# Patient Record
Sex: Female | Born: 1985 | Race: White | Hispanic: No | Marital: Married | Smoking: Former smoker
Health system: Southern US, Community
[De-identification: ages and names within clinical notes are randomized; demographics above are authoritative.]

## PROBLEM LIST (undated history)

## (undated) DIAGNOSIS — IMO0002 Reserved for concepts with insufficient information to code with codable children: Secondary | ICD-10-CM

## (undated) DIAGNOSIS — F329 Major depressive disorder, single episode, unspecified: Secondary | ICD-10-CM

## (undated) DIAGNOSIS — Z8639 Personal history of other endocrine, nutritional and metabolic disease: Secondary | ICD-10-CM

## (undated) DIAGNOSIS — E041 Nontoxic single thyroid nodule: Secondary | ICD-10-CM

## (undated) DIAGNOSIS — E079 Disorder of thyroid, unspecified: Secondary | ICD-10-CM

## (undated) DIAGNOSIS — F32A Depression, unspecified: Secondary | ICD-10-CM

## (undated) DIAGNOSIS — R87629 Unspecified abnormal cytological findings in specimens from vagina: Secondary | ICD-10-CM

## (undated) DIAGNOSIS — J302 Other seasonal allergic rhinitis: Secondary | ICD-10-CM

## (undated) DIAGNOSIS — F419 Anxiety disorder, unspecified: Secondary | ICD-10-CM

## (undated) DIAGNOSIS — B977 Papillomavirus as the cause of diseases classified elsewhere: Secondary | ICD-10-CM

## (undated) HISTORY — DX: Papillomavirus as the cause of diseases classified elsewhere: B97.7

## (undated) HISTORY — DX: Reserved for concepts with insufficient information to code with codable children: IMO0002

## (undated) HISTORY — DX: Depression, unspecified: F32.A

## (undated) HISTORY — PX: TUBAL LIGATION: SHX77

## (undated) HISTORY — PX: COLPOSCOPY: SHX161

## (undated) HISTORY — DX: Anxiety disorder, unspecified: F41.9

## (undated) HISTORY — DX: Disorder of thyroid, unspecified: E07.9

## (undated) HISTORY — DX: Major depressive disorder, single episode, unspecified: F32.9

## (undated) HISTORY — DX: Personal history of other endocrine, nutritional and metabolic disease: Z86.39

## (undated) HISTORY — DX: Unspecified abnormal cytological findings in specimens from vagina: R87.629

---
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
## 2009-11-22 DIAGNOSIS — R87619 Unspecified abnormal cytological findings in specimens from cervix uteri: Secondary | ICD-10-CM

## 2009-11-22 DIAGNOSIS — IMO0002 Reserved for concepts with insufficient information to code with codable children: Secondary | ICD-10-CM

## 2009-11-22 HISTORY — DX: Reserved for concepts with insufficient information to code with codable children: IMO0002

## 2009-11-22 HISTORY — DX: Unspecified abnormal cytological findings in specimens from cervix uteri: R87.619

## 2009-11-22 HISTORY — PX: CHOLECYSTECTOMY: SHX55

## 2010-02-27 ENCOUNTER — Inpatient Hospital Stay (HOSPITAL_COMMUNITY): Admission: AD | Admit: 2010-02-27 | Discharge: 2010-02-28 | Payer: Self-pay | Admitting: Obstetrics and Gynecology

## 2010-07-22 ENCOUNTER — Inpatient Hospital Stay (HOSPITAL_COMMUNITY): Admission: RE | Admit: 2010-07-22 | Discharge: 2010-07-24 | Payer: Self-pay | Admitting: Obstetrics and Gynecology

## 2010-09-05 ENCOUNTER — Emergency Department (HOSPITAL_COMMUNITY): Admission: EM | Admit: 2010-09-05 | Discharge: 2010-09-06 | Payer: Self-pay | Admitting: Emergency Medicine

## 2011-02-03 LAB — URINALYSIS, ROUTINE W REFLEX MICROSCOPIC
Glucose, UA: NEGATIVE mg/dL
Ketones, ur: NEGATIVE mg/dL
Nitrite: NEGATIVE
Protein, ur: NEGATIVE mg/dL

## 2011-02-03 LAB — CBC
HCT: 35.8 % — ABNORMAL LOW (ref 36.0–46.0)
Hemoglobin: 12.2 g/dL (ref 12.0–15.0)
MCHC: 34.1 g/dL (ref 30.0–36.0)
MCV: 92.3 fL (ref 78.0–100.0)
WBC: 12.6 10*3/uL — ABNORMAL HIGH (ref 4.0–10.5)

## 2011-02-03 LAB — COMPREHENSIVE METABOLIC PANEL
ALT: 20 U/L (ref 0–35)
Alkaline Phosphatase: 75 U/L (ref 39–117)
CO2: 22 mEq/L (ref 19–32)
GFR calc non Af Amer: >60 mL/min (ref >60)
Glucose, Bld: 171 mg/dL — ABNORMAL HIGH (ref 70–99)
Potassium: 3.5 mEq/L (ref 3.5–5.1)
Sodium: 137 mEq/L (ref 135–145)

## 2011-02-03 LAB — DIFFERENTIAL
Basophils Absolute: 0 10*3/uL (ref 0.0–0.1)
Basophils Relative: 0 % (ref 0–1)
Eosinophils Absolute: 0.2 10*3/uL (ref 0.0–0.7)
Neutrophils Relative %: 70 % (ref 43–77)

## 2011-02-03 LAB — URINE MICROSCOPIC-ADD ON

## 2011-02-03 LAB — LIPASE, BLOOD: Lipase: 28 U/L (ref 11–59)

## 2011-02-04 LAB — CBC
Hemoglobin: 10.9 g/dL — ABNORMAL LOW (ref 12.0–15.0)
MCH: 33.6 pg (ref 26.0–34.0)
RBC: 3.23 MIL/uL — ABNORMAL LOW (ref 3.87–5.11)

## 2011-02-05 LAB — CBC
HCT: 35.2 % — ABNORMAL LOW (ref 36.0–46.0)
MCV: 98.2 fL (ref 78.0–100.0)
RDW: 12.9 % (ref 11.5–15.5)
WBC: 15 10*3/uL — ABNORMAL HIGH (ref 4.0–10.5)

## 2011-02-10 LAB — WET PREP, GENITAL: Yeast Wet Prep HPF POC: NONE SEEN

## 2011-02-10 LAB — URINE CULTURE: Culture: NO GROWTH

## 2011-02-10 LAB — URINE MICROSCOPIC-ADD ON

## 2011-02-10 LAB — URINALYSIS, ROUTINE W REFLEX MICROSCOPIC
Glucose, UA: NEGATIVE mg/dL
Protein, ur: NEGATIVE mg/dL
Specific Gravity, Urine: <1.005 — ABNORMAL LOW (ref 1.005–1.030)

## 2011-10-13 ENCOUNTER — Other Ambulatory Visit: Payer: Self-pay | Admitting: Internal Medicine

## 2011-10-13 DIAGNOSIS — R5383 Other fatigue: Secondary | ICD-10-CM

## 2011-10-13 DIAGNOSIS — E049 Nontoxic goiter, unspecified: Secondary | ICD-10-CM

## 2011-10-21 ENCOUNTER — Other Ambulatory Visit: Payer: Self-pay

## 2011-11-11 ENCOUNTER — Ambulatory Visit: Payer: Self-pay | Admitting: Family Medicine

## 2011-11-23 ENCOUNTER — Ambulatory Visit (INDEPENDENT_AMBULATORY_CARE_PROVIDER_SITE_OTHER): Payer: PRIVATE HEALTH INSURANCE

## 2011-11-23 DIAGNOSIS — J029 Acute pharyngitis, unspecified: Secondary | ICD-10-CM

## 2011-11-23 DIAGNOSIS — J019 Acute sinusitis, unspecified: Secondary | ICD-10-CM

## 2011-11-23 DIAGNOSIS — R05 Cough: Secondary | ICD-10-CM

## 2011-11-23 DIAGNOSIS — R059 Cough, unspecified: Secondary | ICD-10-CM

## 2012-03-02 ENCOUNTER — Ambulatory Visit (INDEPENDENT_AMBULATORY_CARE_PROVIDER_SITE_OTHER): Payer: PRIVATE HEALTH INSURANCE | Admitting: Emergency Medicine

## 2012-03-02 VITALS — BP 100/70 | HR 87 | Temp 98.6°F | Resp 18 | Ht 65.5 in | Wt 156.0 lb

## 2012-03-02 DIAGNOSIS — E860 Dehydration: Secondary | ICD-10-CM

## 2012-03-02 DIAGNOSIS — R197 Diarrhea, unspecified: Secondary | ICD-10-CM

## 2012-03-02 DIAGNOSIS — R1110 Vomiting, unspecified: Secondary | ICD-10-CM

## 2012-03-02 DIAGNOSIS — R112 Nausea with vomiting, unspecified: Secondary | ICD-10-CM

## 2012-03-02 LAB — POCT CBC
HCT, POC: 46.4 % (ref 37.7–47.9)
Lymph, poc: 1 (ref 0.6–3.4)
MCHC: 33 g/dL (ref 31.8–35.4)
MCV: 93.8 fL (ref 80–97)
POC LYMPH PERCENT: 13.3 %L (ref 10–50)
RDW, POC: 12.6 %
WBC: 7.2 10*3/uL (ref 4.6–10.2)

## 2012-03-02 MED ORDER — ONDANSETRON 8 MG PO TBDP: 8.0000 mg | ORAL_TABLET | Freq: Three times a day (TID) | ORAL | Status: AC | PRN

## 2012-03-02 MED ORDER — ONDANSETRON 4 MG PO TBDP
8.0000 mg | ORAL_TABLET | Freq: Once | ORAL | Status: AC
  Administered 2012-03-02: 8 mg via ORAL

## 2012-03-02 NOTE — Progress Notes (Signed)
  Subjective:    Patient ID: Brandi Burns, female    DOB: 05/06/1986, 26 y.o.   MRN: [id number]  HPI patient enters with onset on Tuesday of nausea vomiting and diarrhea. She has felt achy like she has the flu. She denies been on antibiotics recently. She denies any recent travel. She has no ongoing medical problems. She states that no one else at home is sick.    Review of Systems noncontributory except as relates to the symptoms     Objective:   Physical Exam  Constitutional: She appears well-developed and well-nourished.  HENT:  Head: Normocephalic.  Right Ear: External ear normal.  Left Ear: External ear normal.  Eyes: Pupils are equal, round, and reactive to light.  Neck: No tracheal deviation present. No thyromegaly present.  Cardiovascular: Normal rate and regular rhythm.   Pulmonary/Chest: No respiratory distress. She has no wheezes. She has no rales. She exhibits no tenderness.  Abdominal: She exhibits no distension. There is no tenderness. There is no rebound and no guarding.          Assessment & Plan:   I suspect this is a viral gastroenteritis. She has had myalgias associated with the vomiting and diarrhea. I suspect her black stools are secondary to the Pepto-Bismol she has been taking. We'll check a CBC and hemosure and go ahead and give IV fluids.

## 2012-03-02 NOTE — Patient Instructions (Signed)
Viral Gastroenteritis Viral gastroenteritis is also known as stomach flu. This condition affects the stomach and intestinal tract. It can cause sudden diarrhea and vomiting. The illness typically lasts 3 to 8 days. Most people develop an immune response that eventually gets rid of the virus. While this natural response develops, the virus can make you quite ill. CAUSES  Many different viruses can cause gastroenteritis, such as rotavirus or noroviruses. You can catch one of these viruses by consuming contaminated food or water. You may also catch a virus by sharing utensils or other personal items with an infected person or by touching a contaminated surface. SYMPTOMS  The most common symptoms are diarrhea and vomiting. These problems can cause a severe loss of body fluids (dehydration) and a body salt (electrolyte) imbalance. Other symptoms may include:  Fever.   Headache.   Fatigue.   Abdominal pain.  DIAGNOSIS  Your caregiver can usually diagnose viral gastroenteritis based on your symptoms and a physical exam. A stool sample may also be taken to test for the presence of viruses or other infections. TREATMENT  This illness typically goes away on its own. Treatments are aimed at rehydration. The most serious cases of viral gastroenteritis involve vomiting so severely that you are not able to keep fluids down. In these cases, fluids must be given through an intravenous line (IV). HOME CARE INSTRUCTIONS   Drink enough fluids to keep your urine clear or pale yellow. Drink small amounts of fluids frequently and increase the amounts as tolerated.   Ask your caregiver for specific rehydration instructions.   Avoid:   Foods high in sugar.   Alcohol.   Carbonated drinks.   Tobacco.   Juice.   Caffeine drinks.   Extremely hot or cold fluids.   Fatty, greasy foods.   Too much intake of anything at one time.   Dairy products until 24 to 48 hours after diarrhea stops.   You may  consume probiotics. Probiotics are active cultures of beneficial bacteria. They may lessen the amount and number of diarrheal stools in adults. Probiotics can be found in yogurt with active cultures and in supplements.   Wash your hands well to avoid spreading the virus.   Only take over-the-counter or prescription medicines for pain, discomfort, or fever as directed by your caregiver. Do not give aspirin to children. Antidiarrheal medicines are not recommended.   Ask your caregiver if you should continue to take your regular prescribed and over-the-counter medicines.   Keep all follow-up appointments as directed by your caregiver.  SEEK IMMEDIATE MEDICAL CARE IF:   You are unable to keep fluids down.   You do not urinate at least once every 6 to 8 hours.   You develop shortness of breath.   You notice blood in your stool or vomit. This may look like coffee grounds.   You have abdominal pain that increases or is concentrated in one small area (localized).   You have persistent vomiting or diarrhea.   You have a fever.   The patient is a child younger than 3 months, and he or she has a fever.   The patient is a child older than 3 months, and he or she has a fever and persistent symptoms.   The patient is a child older than 3 months, and he or she has a fever and symptoms suddenly get worse.   The patient is a baby, and he or she has no tears when crying.  MAKE SURE YOU:     Understand these instructions.   Will watch your condition.   Will get help right away if you are not doing well or get worse.  Document Released: 11/08/2005 Document Revised: 10/28/2011 Document Reviewed: 08/25/2011 [employer] Patient Information 2012 [employer]. 

## 2012-06-27 ENCOUNTER — Ambulatory Visit (INDEPENDENT_AMBULATORY_CARE_PROVIDER_SITE_OTHER): Payer: Self-pay | Admitting: Obstetrics and Gynecology

## 2012-06-27 VITALS — Temp 98.1°F

## 2012-06-27 DIAGNOSIS — H6690 Otitis media, unspecified, unspecified ear: Secondary | ICD-10-CM | POA: Insufficient documentation

## 2012-06-27 MED ORDER — AZITHROMYCIN 1 G PO PACK: 1.0000 | PACK | Freq: Once | ORAL | Status: AC

## 2012-06-27 NOTE — Progress Notes (Signed)
PROBLEM VISIT  Brandi Burns is a 26 y.o. year old female,., who presents for a problem visit.   Subjective:  Pt gives a 24 hr hx of generalized malaise, upper respiratory stuffiness, and bilateral ear pain.  No cough.   Objective:  Temp 98.1 F (36.7 C)  HEENT:   No submandibular, pre-auricular, posterior canalicular lymphadenopathy. Thyroid is normal the left ear contains fluid behind the drum.  There is no mandibular sinus tenderness.  Assessment: Otitis   Plan: Over-the-counter Zyrtec once daily Z-Pak for 5 days Return to office prn if symptoms worsen or fail to improve.   Dierdre Forth,   06/28/2012 5:12 PM

## 2012-10-23 ENCOUNTER — Ambulatory Visit (INDEPENDENT_AMBULATORY_CARE_PROVIDER_SITE_OTHER): Payer: PRIVATE HEALTH INSURANCE | Admitting: Obstetrics and Gynecology

## 2012-10-23 ENCOUNTER — Encounter: Payer: Self-pay | Admitting: Obstetrics and Gynecology

## 2012-10-23 VITALS — BP 92/64 | HR 72 | Ht 65.0 in | Wt 145.0 lb

## 2012-10-23 DIAGNOSIS — Z30432 Encounter for removal of intrauterine contraceptive device: Secondary | ICD-10-CM

## 2012-10-23 DIAGNOSIS — E049 Nontoxic goiter, unspecified: Secondary | ICD-10-CM

## 2012-10-23 DIAGNOSIS — IMO0001 Reserved for inherently not codable concepts without codable children: Secondary | ICD-10-CM

## 2012-10-23 DIAGNOSIS — Z124 Encounter for screening for malignant neoplasm of cervix: Secondary | ICD-10-CM

## 2012-10-23 DIAGNOSIS — E010 Iodine-deficiency related diffuse (endemic) goiter: Secondary | ICD-10-CM

## 2012-10-23 DIAGNOSIS — Z309 Encounter for contraceptive management, unspecified: Secondary | ICD-10-CM

## 2012-10-23 NOTE — Progress Notes (Signed)
Regular Periods: no Mammogram: no  Monthly Breast Ex.: no Exercise: yes  Tetanus < 10 years: yes Seatbelts: yes  NI. Bladder Functn.: yes Abuse at home: no  Daily BM's: yes Stressful Work: yes  Healthy Diet: yes Sigmoid-Colonoscopy: NO  Calcium: no Medical problems this year: WANT IUD REMOVED AND DISCUSS ANOTHER OPTION   LAST PAP:12/12  NL  Contraception:IUD  MIRENA  Mammogram:  NO  PCP: NO  PMH: NO CHANGE  FMH: NO CHANGE   Last Bone Scan: NO  PT IS MARRIED.

## 2012-10-23 NOTE — Progress Notes (Signed)
Subjective:    Brandi Burns is a 26 y.o. female, G2P2, who presents for an annual exam. The patient reports that since she's had the Mirena IUD she's had problems with acne and therefore wants it removed and to resume BCPs.  Reviewed VTE risks of BCPs and need to dose at the same time daily.  Menstrual cycle:   LMP: Patient's last menstrual period was 10/13/2012.             Review of Systems Pertinent items are noted in HPI. Denies pelvic pain, urinary tract symptoms, vaginitis symptoms, irregular bleeding, menopausal symptoms, change in bowel habits or rectal bleeding   Objective:    BP 92/64  Pulse 72  Wt 145 lb (65.772 kg)  LMP 10/13/2012    Wt Readings from Last 1 Encounters:  10/23/12 145 lb (65.772 kg)   There is no height on file to calculate BMI. General Appearance: Alert, no acute distress HEENT: Grossly normal; acne Neck / Thyroid: Supple, diffuse  thyromegaly but no cervical adenopathy Lungs: Clear to auscultation bilaterally Back: No CVA tenderness Breast Exam: No masses or nodes.No dimpling, nipple retraction or discharge. Cardiovascular: Regular rate and rhythm.  Gastrointestinal: Soft, non-tender, no masses or organomegaly Pelvic Exam: EGBUS-wnl, vagina-normal rugae, cervix- without lesions or tenderness, IUD removed without difficulty; uterus appears normal size shape and consistency, adnexae-no masses or tenderness Lymphatic Exam: Non-palpable nodes in neck, clavicular,  axillary, or inguinal regions  Skin: no rashes or abnormalities Extremities: no clubbing cyanosis or edema  Neurologic: grossly normal Psychiatric: Alert and oriented   Assessment:   Routine GYN Exam IUD Removal Thyromegaly Family History of Thyroid Disease   Plan:  Generess Fe  #2 samples given to begin today 1 po qd  TSH and thyroid ultrasound to be scheduled the first of the year once patient's new insurance starts  PAP sent  RTO 1 year or prn  Jahkari Maclin,ELMIRAPA-C

## 2012-10-24 LAB — PAP IG W/ RFLX HPV ASCU

## 2012-11-06 ENCOUNTER — Other Ambulatory Visit: Payer: PRIVATE HEALTH INSURANCE

## 2012-11-06 ENCOUNTER — Other Ambulatory Visit: Payer: Self-pay | Admitting: Obstetrics and Gynecology

## 2012-11-06 DIAGNOSIS — J029 Acute pharyngitis, unspecified: Secondary | ICD-10-CM

## 2012-11-06 LAB — RAPID STREP SCREEN (MED CTR MEBANE ONLY): Streptococcus, Group A Screen (Direct): NEGATIVE

## 2012-12-01 ENCOUNTER — Other Ambulatory Visit: Payer: Self-pay

## 2012-12-01 DIAGNOSIS — E049 Nontoxic goiter, unspecified: Secondary | ICD-10-CM

## 2012-12-05 ENCOUNTER — Ambulatory Visit (INDEPENDENT_AMBULATORY_CARE_PROVIDER_SITE_OTHER): Payer: BC Managed Care – PPO | Admitting: Family Medicine

## 2012-12-05 VITALS — BP 124/70 | HR 79 | Temp 98.7°F | Resp 16 | Ht 66.0 in | Wt 152.0 lb

## 2012-12-05 DIAGNOSIS — R4586 Emotional lability: Secondary | ICD-10-CM

## 2012-12-05 DIAGNOSIS — R5383 Other fatigue: Secondary | ICD-10-CM

## 2012-12-05 DIAGNOSIS — R5381 Other malaise: Secondary | ICD-10-CM

## 2012-12-05 DIAGNOSIS — D72829 Elevated white blood cell count, unspecified: Secondary | ICD-10-CM

## 2012-12-05 DIAGNOSIS — F39 Unspecified mood [affective] disorder: Secondary | ICD-10-CM

## 2012-12-05 LAB — POCT CBC
Granulocyte percent: 63.1 %G (ref 37–80)
HCT, POC: 46.7 % (ref 37.7–47.9)
Hemoglobin: 14.8 g/dL (ref 12.2–16.2)
Lymph, poc: 3.7 — AB (ref 0.6–3.4)
MCH, POC: 31.1 pg (ref 27–31.2)
MCHC: 31.7 g/dL — AB (ref 31.8–35.4)
MCV: 98.1 fL — AB (ref 80–97)
MID (cbc): 0.5 (ref 0–0.9)
MPV: 8.9 fL (ref 0–99.8)
POC Granulocyte: 7.3 — AB (ref 2–6.9)
POC LYMPH PERCENT: 32.3 % (ref 10–50)
POC MID %: 4.6 % (ref 0–12)
Platelet Count, POC: 369 10*3/uL (ref 142–424)
RBC: 4.76 M/uL (ref 4.04–5.48)
RDW, POC: 12.5 %
WBC: 11.6 10*3/uL — AB (ref 4.6–10.2)

## 2012-12-05 LAB — POCT UA - MICROSCOPIC ONLY
Bacteria, U Microscopic: NEGATIVE
Casts, Ur, LPF, POC: NEGATIVE
Crystals, Ur, HPF, POC: NEGATIVE
Mucus, UA: NEGATIVE
WBC, Ur, HPF, POC: NEGATIVE
Yeast, UA: NEGATIVE

## 2012-12-05 LAB — POCT URINALYSIS DIPSTICK
Bilirubin, UA: NEGATIVE
Glucose, UA: NEGATIVE
Leukocytes, UA: NEGATIVE
Nitrite, UA: NEGATIVE
Protein, UA: NEGATIVE
Spec Grav, UA: 1.015
Urobilinogen, UA: 0.2
pH, UA: 6.5

## 2012-12-05 LAB — POCT SEDIMENTATION RATE: POCT SED RATE: 21 mm/h (ref 0–22)

## 2012-12-05 NOTE — Progress Notes (Signed)
 [facility]:  Office Visit  Chief Complaint:  Chief Complaint  Patient presents with  . Fatigue    extreme fatigue * about 6 months  . Mood Issues    wants to discuss mood issues    HPI: Brandi Burns is a 27 y.o. female who complains of  bad mood swings, fatigue for the last 6 months. She denies any weight gain, memory issues, dry skin, hair loss. Denies  Mania or  Depression. Checked thyroid with her other physician and TSH was normal at [place], [place]. Going to get [place] for ? Enlarge thyroid on Monday.  Irritable per patient-- for example will get irritated if husband moves in bed and she is in a comfortable position, turns the wrong way in bed and causese her to be uncomfortable and so she becomes moody. Not related to menses. Denies SI/HI or prior h/o mood disorders. Works as Lawyer   Past Medical History  Diagnosis Date  . HPV in female   . Abnormal Pap smear 2011   Past Surgical History  Procedure Date  . Cholecystectomy   . Colposcopy    History   Social History  . Marital Status: Married    Spouse Name: N/A    Number of Children: N/A  . Years of Education: N/A   Social History Main Topics  . Smoking status: Current Every Day Smoker -- 0.5 packs/day for 10 years    Types: Cigarettes  . Smokeless tobacco: Never Used  . Alcohol Use: No  . Drug Use: No  . Sexually Active: Yes    Birth Control/ Protection: IUD     Comment: MIRENA   Other Topics Concern  . None   Social History Narrative  . None   Family History  Problem Relation Age of Onset  . Heart disease Maternal Uncle   . Emphysema Maternal Grandmother   . Thyroid disease Maternal Grandmother   . Aneurysm Maternal Grandmother   . Heart disease Maternal Grandfather   . Diabetes Maternal Grandfather   . Hypertension Maternal Grandfather    Allergies  Allergen Reactions  . Wellbutrin (Bupropion Hcl) Hives   Prior to Admission medications   Medication Sig Start Date End  Date Taking? Authorizing Provider  Norethindrone-Ethinyl Estradiol-Fe (GENERESS FE) 0.8-25 MG-MCG tablet Chew 1 tablet by mouth daily.   Yes Historical Provider, MD  ALPRAZolam Prudy Feeler) 0.5 MG tablet Take 0.5 mg by mouth at bedtime as needed.    Historical Provider, MD     ROS: The patient denies fevers, chills, night sweats, unintentional weight loss, chest pain, palpitations, wheezing, dyspnea on exertion, nausea, vomiting, abdominal pain, dysuria, hematuria, melena, numbness, weakness, or tingling.   All other systems have been reviewed and were otherwise negative with the exception of those mentioned in the HPI and as above.    PHYSICAL EXAM: Filed Vitals:   12/05/12 1834  BP: 124/70  Pulse: 79  Temp: 98.7 F (37.1 C)  Resp: 16   Filed Vitals:   12/05/12 1834  Height: 5\' 6"  (1.676 m)  Weight: 152 lb (68.947 kg)   Body mass index is 24.53 kg/(m^2).  General: Alert, no acute distress HEENT:  Normocephalic, atraumatic, oropharynx patent. EOMI, PERRLA Cardiovascular:  Regular rate and rhythm, no rubs murmurs or gallops.  No Carotid bruits, radial pulse intact. No pedal edema.  Respiratory: Clear to auscultation bilaterally.  No wheezes, rales, or rhonchi.  No cyanosis, no use of accessory musculature GI: No organomegaly, abdomen is soft  and non-tender, positive bowel sounds.  No masses. Skin: No rashes. Neurologic: Facial musculature symmetric. Psychiatric: Patient is appropriate throughout our interaction. Lymphatic: No cervical lymphadenopathy Musculoskeletal: Gait intact.   LABS: Results for orders placed in visit on 12/05/12  POCT CBC      Component Value Range   WBC 11.6 (*) 4.6 - 10.2 K/uL   Lymph, poc 3.7 (*) 0.6 - 3.4   POC LYMPH PERCENT 32.3  10 - 50 %L   MID (cbc) 0.5  0 - 0.9   POC MID % 4.6  0 - 12 %M   POC Granulocyte 7.3 (*) 2 - 6.9   Granulocyte percent 63.1  37 - 80 %G   RBC 4.76  4.04 - 5.48 M/uL   Hemoglobin 14.8  12.2 - 16.2 g/dL   HCT, POC 96.0   45.4 - 47.9 %   MCV 98.1 (*) 80 - 97 fL   MCH, POC 31.1  27 - 31.2 pg   MCHC 31.7 (*) 31.8 - 35.4 g/dL   RDW, POC 09.8     Platelet Count, POC 369  142 - 424 K/uL   MPV 8.9  0 - 99.8 fL  POCT URINALYSIS DIPSTICK      Component Value Range   Color, UA yellow     Clarity, UA clear     Glucose, UA neg     Bilirubin, UA neg     Ketones, UA eng     Spec Grav, UA 1.015     Blood, UA moderate     pH, UA 6.5     Protein, UA neg     Urobilinogen, UA 0.2     Nitrite, UA neg     Leukocytes, UA Negative    POCT UA - MICROSCOPIC ONLY      Component Value Range   WBC, Ur, HPF, POC neg     RBC, urine, microscopic 2-6     Bacteria, U Microscopic neg     Mucus, UA neg     Epithelial cells, urine per micros 0-1     Crystals, Ur, HPF, POC neg     Casts, Ur, LPF, POC neg     Yeast, UA neg       EKG/XRAY:   Primary read interpreted by Dr. Conley Rolls at [facility].   ASSESSMENT/PLAN: Encounter Diagnoses  Name Primary?  . Mood swings Yes  . Fatigue   . Leukocytosis    She has a leukocytosis I can't explain and some hematuria. Denies vaginal sxs, urinary sxs. I will get the following labs. Labs pending: ESR, Vit B12, Folate, Vit D, CMP If labs are WNL then consider using Prozac 20 mg daily as a trial for irritability.  Planning to get US thyroid on MOnday with Ob/Gyn She denies any SI/HI.  F/u prn    ,  PHUONG, DO 12/05/2012 8:35 PM

## 2012-12-06 LAB — COMPREHENSIVE METABOLIC PANEL
ALT: <8 U/L (ref 0–35)
AST: 9 U/L (ref 0–37)
Albumin: 4.6 g/dL (ref 3.5–5.2)
CO2: 26 mEq/L (ref 19–32)
Calcium: 9.8 mg/dL (ref 8.4–10.5)
Chloride: 103 mEq/L (ref 96–112)
Creat: 0.6 mg/dL (ref 0.50–1.10)
Potassium: 4.1 mEq/L (ref 3.5–5.3)
Sodium: 139 mEq/L (ref 135–145)
Total Protein: 7 g/dL (ref 6.0–8.3)

## 2012-12-06 LAB — FOLATE: Folate: 18 ng/mL

## 2012-12-06 LAB — COMPREHENSIVE METABOLIC PANEL WITH GFR
Alkaline Phosphatase: 57 U/L (ref 39–117)
BUN: 7 mg/dL (ref 6–23)
Glucose, Bld: 95 mg/dL (ref 70–99)
Total Bilirubin: 0.3 mg/dL (ref 0.3–1.2)

## 2012-12-06 LAB — VITAMIN B12: Vitamin B-12: 281 pg/mL (ref 211–911)

## 2012-12-07 ENCOUNTER — Telehealth: Payer: Self-pay

## 2012-12-07 ENCOUNTER — Other Ambulatory Visit: Payer: Self-pay | Admitting: Family Medicine

## 2012-12-07 ENCOUNTER — Telehealth: Payer: Self-pay | Admitting: Family Medicine

## 2012-12-07 DIAGNOSIS — E569 Vitamin deficiency, unspecified: Secondary | ICD-10-CM

## 2012-12-07 DIAGNOSIS — F39 Unspecified mood [affective] disorder: Secondary | ICD-10-CM

## 2012-12-07 LAB — VITAMIN D 25 HYDROXY (VIT D DEFICIENCY, FRACTURES): Vit D, 25-Hydroxy: 21 ng/mL — ABNORMAL LOW (ref 30–89)

## 2012-12-07 MED ORDER — FLUOXETINE HCL 20 MG PO TABS: 20.0000 mg | ORAL_TABLET | Freq: Every day | ORAL | Status: DC

## 2012-12-07 MED ORDER — VITAMIN D (ERGOCALCIFEROL) 1.25 MG (50000 UNIT) PO CAPS: 50000.0000 [IU] | ORAL_CAPSULE | ORAL | Status: DC

## 2012-12-07 NOTE — Telephone Encounter (Signed)
Dr. Conley Rolls    Patient returned your call   CBN [phone]

## 2012-12-07 NOTE — Telephone Encounter (Signed)
LM to return call regarding labs. Sed rate, B 12 normal range. She is Vit D deficient, that may be why she is tired but I am not sure. We can try giving her Vit D and see how she does. For mood swings I would consider Prozac 20 mg and see how she does. Will d/w pt option once we talk about labs.

## 2012-12-11 ENCOUNTER — Ambulatory Visit
Admission: RE | Admit: 2012-12-11 | Discharge: 2012-12-11 | Disposition: A | Discharge status: Home / Self Care | Payer: BC Managed Care – PPO | Source: Ambulatory Visit | Attending: Obstetrics and Gynecology | Admitting: Obstetrics and Gynecology

## 2012-12-11 DIAGNOSIS — E049 Nontoxic goiter, unspecified: Secondary | ICD-10-CM

## 2012-12-13 ENCOUNTER — Other Ambulatory Visit: Payer: Self-pay | Admitting: Obstetrics and Gynecology

## 2013-01-12 ENCOUNTER — Other Ambulatory Visit: Payer: Self-pay | Admitting: Endocrinology

## 2013-01-12 DIAGNOSIS — E049 Nontoxic goiter, unspecified: Secondary | ICD-10-CM

## 2013-01-22 ENCOUNTER — Encounter: Payer: Self-pay | Admitting: Obstetrics and Gynecology

## 2013-01-22 ENCOUNTER — Ambulatory Visit: Payer: BC Managed Care – PPO | Admitting: Obstetrics and Gynecology

## 2013-01-22 VITALS — BP 114/60 | Temp 99.0°F | Wt 155.0 lb

## 2013-01-22 DIAGNOSIS — R05 Cough: Secondary | ICD-10-CM

## 2013-01-22 DIAGNOSIS — J329 Chronic sinusitis, unspecified: Secondary | ICD-10-CM

## 2013-01-22 MED ORDER — GUAIFENESIN-CODEINE 100-10 MG/5ML PO SOLN: 5.0000 mL | Freq: Three times a day (TID) | ORAL | Status: DC | PRN

## 2013-01-22 MED ORDER — AMOXICILLIN-POT CLAVULANATE 875-125 MG PO TABS: 1.0000 | ORAL_TABLET | Freq: Two times a day (BID) | ORAL | Status: DC

## 2013-01-22 NOTE — Progress Notes (Signed)
26 YO complaining of earache, green mucus from nose and cough since last week with the cough being disruptive but dry.  Nasal congestion has been bad but has not taking anything for it.  Has used saline nasal spray.  In addition to ears being painful she has popping-R > L.  Denies fever, body aches, nausea, vomiting or diarrhea.  O:  ENT: TMs obscured by cerumen on right and left with ? scar but no erythema, nares-boggy, oropharynx-no erythema       Neck: no masses or thyromegaly       Lungs: clear to auscultation but coarse breath sounds (patient coughing throughout exam       Heart: RRR  A: Sinusitis     Cough   P:  Norel AD #1 1 po stat           Augmentin 875 mg  #14 bid x 7 days no refills       Guaifenesin/Codiene #120 mL  5 mL tid prn no refills        Saline nasal rinse          Sudafed as directed         RTO-as scheduled or prn  POWELL,ELMIRA, PA-C

## 2013-01-26 ENCOUNTER — Other Ambulatory Visit: Payer: Self-pay | Admitting: Obstetrics and Gynecology

## 2013-01-26 MED ORDER — AMBULATORY NON FORMULARY MEDICATION: Status: DC

## 2013-01-26 MED ORDER — ALBUTEROL SULFATE HFA 108 (90 BASE) MCG/ACT IN AERS: 2.0000 | INHALATION_SPRAY | Freq: Four times a day (QID) | RESPIRATORY_TRACT | Status: DC | PRN

## 2013-01-26 MED ORDER — BENZONATATE 100 MG PO CAPS: 200.0000 mg | ORAL_CAPSULE | Freq: Three times a day (TID) | ORAL | Status: DC | PRN

## 2013-02-05 ENCOUNTER — Encounter: Payer: Self-pay | Admitting: Family Medicine

## 2013-02-05 ENCOUNTER — Other Ambulatory Visit: Payer: Self-pay | Admitting: Family Medicine

## 2013-02-05 ENCOUNTER — Telehealth: Payer: Self-pay | Admitting: Family Medicine

## 2013-04-04 ENCOUNTER — Ambulatory Visit (INDEPENDENT_AMBULATORY_CARE_PROVIDER_SITE_OTHER): Payer: BC Managed Care – PPO | Admitting: Emergency Medicine

## 2013-04-04 VITALS — BP 110/68 | HR 73 | Temp 98.1°F | Resp 16 | Ht 66.0 in | Wt 156.0 lb

## 2013-04-04 DIAGNOSIS — M25561 Pain in right knee: Secondary | ICD-10-CM

## 2013-04-04 DIAGNOSIS — M25569 Pain in unspecified knee: Secondary | ICD-10-CM

## 2013-04-04 DIAGNOSIS — M2390 Unspecified internal derangement of unspecified knee: Secondary | ICD-10-CM

## 2013-04-04 MED ORDER — NAPROXEN SODIUM 550 MG PO TABS: 550.0000 mg | ORAL_TABLET | Freq: Two times a day (BID) | ORAL | Status: DC

## 2013-04-04 NOTE — Progress Notes (Signed)
[facility] [address] [place] [address] [phone]  Date:  04/04/2013   Name:  Brandi Burns   DOB:  [birth year]   MRN:  [id number]  PCP:  Pcp Not In System    Chief Complaint: Knee Pain   History of Present Illness:  Brandi Burns is a 27 y.o. very pleasant female patient who presents with the following:  No history of injury or overuse of right knee. Has pain with extension and prolonged standing and walking.  No effusion. Has a click but no locking.  No history of prior knee injury.  Works as a Engineer, site.  No improvement with over the counter medications or other home remedies. Denies other complaint or health concern today.   Patient Active Problem List   Diagnosis Date Noted  . Otitis media 06/27/2012    Past Medical History  Diagnosis Date  . HPV in female   . Abnormal Pap smear 2011  . H/O thyroid nodule   . Depression   . Thyroid disease     Past Surgical History  Procedure Laterality Date  . Cholecystectomy    . Colposcopy      History  Substance Use Topics  . Smoking status: Current Every Day Smoker -- 0.50 packs/day for 10 years    Types: Cigarettes  . Smokeless tobacco: Never Used  . Alcohol Use: No    Family History  Problem Relation Age of Onset  . Heart disease Maternal Uncle   . Emphysema Maternal Grandmother   . Thyroid disease Maternal Grandmother   . Aneurysm Maternal Grandmother   . Heart disease Maternal Grandfather   . Diabetes Maternal Grandfather   . Hypertension Maternal Grandfather     Allergies  Allergen Reactions  . Wellbutrin (Bupropion Hcl) Hives    Medication list has been reviewed and updated.  Current Outpatient Prescriptions on File Prior to Visit  Medication Sig Dispense Refill  . albuterol (PROVENTIL HFA;VENTOLIN HFA) 108 (90 BASE) MCG/ACT inhaler Inhale 2 puffs into the lungs every 6 (six) hours as needed for wheezing. 2 puffs every 6 hours x 5 days then prn-wheezing  1  Inhaler  2  . AMBULATORY NON FORMULARY MEDICATION Spacer to be used with Ventolin Inhaler-please instruct patient  1 Units  0  . amoxicillin-clavulanate (AUGMENTIN) 875-125 MG per tablet Take 1 tablet by mouth 2 (two) times daily.  14 tablet  0  . benzonatate (TESSALON) 100 MG capsule Take 2 capsules (200 mg total) by mouth 3 (three) times daily as needed for cough.  21 capsule  0  . FLUoxetine (PROZAC) 20 MG tablet Take 1 tablet (20 mg total) by mouth daily.  30 tablet  2  . guaiFENesin-codeine 100-10 MG/5ML syrup Take 5 mLs by mouth 3 (three) times daily as needed for cough.  120 mL  0  . Norethindrone-Ethinyl Estradiol-Fe (GENERESS FE) 0.8-25 MG-MCG tablet Chew 1 tablet by mouth daily.      . Vitamin D, Ergocalciferol, (DRISDOL) 50000 UNITS CAPS Take 1 capsule (50,000 Units total) by mouth every 7 (seven) days. Return in 12 weeks for repeat labs  12 capsule  0   No current facility-administered medications on file prior to visit.    Review of Systems:  As per HPI, otherwise negative.    Physical Examination: Filed Vitals:   04/04/13 0843  BP: 110/68  Pulse: 73  Temp: 98.1 F (36.7 C)  Resp: 16   Filed Vitals:   04/04/13 0843  Height: 5\' 6"  (1.676 m)  Weight: 156 lb (70.761 kg)   Body mass index is 25.19 kg/(m^2). Ideal Body Weight: Weight in (lb) to have BMI = 25: 154.6   GEN: WDWN, NAD, Non-toxic, Alert & Oriented x 3 HEENT: Atraumatic, Normocephalic.  Ears and Nose: No external deformity. EXTR: No clubbing/cyanosis/edema NEURO: Normal gait.  PSYCH: Normally interactive. Conversant. Not depressed or anxious appearing.  Calm demeanor.  RIGHT KNEE:  Warm.  No effusion.  Has palpable click.  Full AROM.   Joint stable   Assessment and Plan: Meniscus tear Anaprox Off work a couple days Follow up in one week   Signed,  Phillips Odor, MD

## 2013-04-04 NOTE — Patient Instructions (Addendum)
Use ice rest knee today RICE: Routine Care for Injuries The routine care of many injuries includes Rest, Ice, Compression, and Elevation (RICE). HOME CARE INSTRUCTIONS  Rest is needed to allow your body to heal. Routine activities can usually be resumed when comfortable. Injured tendons and bones can take up to 6 weeks to heal. Tendons are the cord-like structures that attach muscle to bone.  Ice following an injury helps keep the swelling down and reduces pain.  Put ice in a plastic bag.  Place a towel between your skin and the bag.  Leave the ice on for 15 to 20 minutes, 3 to 4 times a day. Do this while awake, for the first 24 to 48 hours. After that, continue as directed by your caregiver.  Compression helps keep swelling down. It also gives support and helps with discomfort. If an elastic bandage has been applied, it should be removed and reapplied every 3 to 4 hours. It should not be applied tightly, but firmly enough to keep swelling down. Watch fingers or toes for swelling, bluish discoloration, coldness, numbness, or excessive pain. If any of these problems occur, remove the bandage and reapply loosely. Contact your caregiver if these problems continue.  Elevation helps reduce swelling and decreases pain. With extremities, such as the arms, hands, legs, and feet, the injured area should be placed near or above the level of the heart, if possible. SEEK IMMEDIATE MEDICAL CARE IF:  You have persistent pain and swelling.  You develop redness, numbness, or unexpected weakness.  Your symptoms are getting worse rather than improving after several days. These symptoms may indicate that further evaluation or further X-rays are needed. Sometimes, X-rays may not show a small broken bone (fracture) until 1 week or 10 days later. Make a follow-up appointment with your caregiver. Ask when your X-ray results will be ready. Make sure you get your X-ray results. Document Released: 02/20/2001  Document Revised: 01/31/2012 Document Reviewed: 04/09/2011 [facility] Patient Information 2013 [place], [place].

## 2013-04-21 ENCOUNTER — Ambulatory Visit (INDEPENDENT_AMBULATORY_CARE_PROVIDER_SITE_OTHER): Payer: BC Managed Care – PPO | Admitting: Family Medicine

## 2013-04-21 VITALS — BP 107/67 | HR 71 | Temp 98.3°F | Resp 16 | Ht 65.0 in | Wt 156.6 lb

## 2013-04-21 DIAGNOSIS — G4700 Insomnia, unspecified: Secondary | ICD-10-CM

## 2013-04-21 DIAGNOSIS — R5383 Other fatigue: Secondary | ICD-10-CM

## 2013-04-21 DIAGNOSIS — R5381 Other malaise: Secondary | ICD-10-CM

## 2013-04-21 DIAGNOSIS — IMO0001 Reserved for inherently not codable concepts without codable children: Secondary | ICD-10-CM

## 2013-04-21 DIAGNOSIS — R635 Abnormal weight gain: Secondary | ICD-10-CM

## 2013-04-21 DIAGNOSIS — M791 Myalgia, unspecified site: Secondary | ICD-10-CM

## 2013-04-21 DIAGNOSIS — F4323 Adjustment disorder with mixed anxiety and depressed mood: Secondary | ICD-10-CM

## 2013-04-21 LAB — POCT CBC
Granulocyte percent: 56 %G (ref 37–80)
MID (cbc): 0.6 (ref 0–0.9)
POC Granulocyte: 4.9 (ref 2–6.9)
POC LYMPH PERCENT: 37.4 %L (ref 10–50)
Platelet Count, POC: 277 10*3/uL (ref 142–424)
RBC: 4.62 M/uL (ref 4.04–5.48)
RDW, POC: 12.5 %

## 2013-04-21 LAB — TSH: TSH: 0.641 u[IU]/mL (ref 0.350–4.500)

## 2013-04-21 MED ORDER — FLUOXETINE HCL 20 MG PO TABS: 20.0000 mg | ORAL_TABLET | Freq: Every day | ORAL | Status: DC

## 2013-04-21 NOTE — Progress Notes (Signed)
Subjective:    Patient ID: Brandi Burns, female    DOB: June 30, 1986, 27 y.o.   MRN: [id number]  HPI Brandi Burns is a 27 y.o. female  Fatigue with muscle soreness more noted in morning.  Started few months ago, but feels like it has gotten worse. Sore into upper shoulders, upper legs, knees, ankles. Feels better by middle of day, then fatigue again at night. Falls asleep ok, but frequently wakes up, some anxious thoughts. Seen by Dr. Conley Rolls in January with similar symptoms. Off prozac for past few weeks, and prior only took every few days. Intially took everyday and helped with mood swings. More fatigued then when seen in January.  Feels depressed or down past few months.  Husband now gone 3 days per week with school, 8yo and 2 yo sons - more work at home with husband gone.  No safety concerns - marriage going well. Walks at work - no other regular exercise. No specific coping techniques with stressors. No SI/HI.  ?anhedonia- doesn't really have time. Too tired to read - likes to read. Vacation next week.   Weight gain - 15 pounds since 10/2012. Hx of thyroid nodules - repeat u/s in August, not big enough for FNA or other workup initially. TSH ok last December.   Low vit D at 21 last ov - treated for 12 weeks.   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 Stressful, but no new stressors. SH: no alcohol, 3/4 ppd tobacco.   Results for orders placed in visit on 12/05/12  VITAMIN B12      Result Value Range   Vitamin B-12 281  211 - 911 pg/mL  FOLATE      Result Value Range   Folate 18.0    VITAMIN D 25 HYDROXY      Result Value Range   Vit D, 25-Hydroxy 21 (*) 30 - 89 ng/mL  COMPREHENSIVE METABOLIC PANEL      Result Value Range   Sodium 139  135 - 145 mEq/L   Potassium 4.1  3.5 - 5.3 mEq/L   Chloride 103  96 - 112 mEq/L   CO2 26  19 - 32 mEq/L   Glucose, Bld 95  70 - 99 mg/dL   BUN 7  6 - 23 mg/dL   Creat 1.47  8.29 - 5.62 mg/dL   Total Bilirubin 0.3  0.3 - 1.2 mg/dL   Alkaline Phosphatase 57   39 - 117 U/L   AST 9  0 - 37 U/L   ALT <8  0 - 35 U/L   Total Protein 7.0  6.0 - 8.3 g/dL   Albumin 4.6  3.5 - 5.2 g/dL   Calcium 9.8  8.4 - 13.0 mg/dL  POCT CBC      Result Value Range   WBC 11.6 (*) 4.6 - 10.2 K/uL   Lymph, poc 3.7 (*) 0.6 - 3.4   POC LYMPH PERCENT 32.3  10 - 50 %L   MID (cbc) 0.5  0 - 0.9   POC MID % 4.6  0 - 12 %M   POC Granulocyte 7.3 (*) 2 - 6.9   Granulocyte percent 63.1  37 - 80 %G   RBC 4.76  4.04 - 5.48 M/uL   Hemoglobin 14.8  12.2 - 16.2 g/dL   HCT, POC 86.5  78.4 - 47.9 %   MCV 98.1 (*) 80 - 97 fL   MCH, POC 31.1  27 - 31.2 pg   MCHC 31.7 (*) 31.8 - 35.4  g/dL   RDW, POC 16.1     Platelet Count, POC 369  142 - 424 K/uL   MPV 8.9  0 - 99.8 fL  POCT URINALYSIS DIPSTICK      Result Value Range   Color, UA yellow     Clarity, UA clear     Glucose, UA neg     Bilirubin, UA neg     Ketones, UA eng     Spec Grav, UA 1.015     Blood, UA moderate     pH, UA 6.5     Protein, UA neg     Urobilinogen, UA 0.2     Nitrite, UA neg     Leukocytes, UA Negative    POCT UA - MICROSCOPIC ONLY      Result Value Range   WBC, Ur, HPF, POC neg     RBC, urine, microscopic 2-6     Bacteria, U Microscopic neg     Mucus, UA neg     Epithelial cells, urine per micros 0-1     Crystals, Ur, HPF, POC neg     Casts, Ur, LPF, POC neg     Yeast, UA neg    POCT SEDIMENTATION RATE      Result Value Range   POCT SED RATE 21  0 - 22 mm/hr      Past Medical History  Diagnosis Date  . HPV in female   . Abnormal Pap smear 2011  . H/O thyroid nodule   . Depression   . Thyroid disease    Past Surgical History  Procedure Laterality Date  . Cholecystectomy    . Colposcopy     Allergies  Allergen Reactions  . Wellbutrin (Bupropion Hcl) Hives   Prior to Admission medications   Medication Sig Start Date End Date Taking? Authorizing Provider  albuterol (PROVENTIL HFA;VENTOLIN HFA) 108 (90 BASE) MCG/ACT inhaler Inhale 2 puffs into the lungs every 6 (six) hours as  needed for wheezing. 2 puffs every 6 hours x 5 days then prn-wheezing 01/26/13  Yes Henreitta Leber, PA-C  etonogestrel-ethinyl estradiol (NUVARING) 0.12-0.015 MG/24HR vaginal ring Place 1 each vaginally every 28 (twenty-eight) days. Insert vaginally and leave in place for 3 consecutive weeks, then remove for 1 week.   Yes Historical Provider, MD  AMBULATORY NON FORMULARY MEDICATION Spacer to be used with Ventolin Inhaler-please instruct patient 01/26/13   Henreitta Leber, PA-C  amoxicillin-clavulanate (AUGMENTIN) 875-125 MG per tablet Take 1 tablet by mouth 2 (two) times daily. 01/22/13   Elmira Powell, PA-C  benzonatate (TESSALON) 100 MG capsule Take 2 capsules (200 mg total) by mouth 3 (three) times daily as needed for cough. 01/26/13   Henreitta Leber, PA-C  FLUoxetine (PROZAC) 20 MG tablet Take 1 tablet (20 mg total) by mouth daily. 12/07/12   Thao P Le, DO  guaiFENesin-codeine 100-10 MG/5ML syrup Take 5 mLs by mouth 3 (three) times daily as needed for cough. 01/22/13   Henreitta Leber, PA-C  naproxen sodium (ANAPROX DS) 550 MG tablet Take 1 tablet (550 mg total) by mouth 2 (two) times daily with a meal. 04/04/13   Phillips Odor, MD  Norethindrone-Ethinyl Estradiol-Fe (GENERESS FE) 0.8-25 MG-MCG tablet Chew 1 tablet by mouth daily.    Historical Provider, MD  Vitamin D, Ergocalciferol, (DRISDOL) 50000 UNITS CAPS Take 1 capsule (50,000 Units total) by mouth every 7 (seven) days. Return in 12 weeks for repeat labs 12/07/12   Lenell Antu, DO   History   Social History  .  Marital Status: Married    Spouse Name: N/A    Number of Children: N/A  . Years of Education: N/A   Occupational History  . Not on file.   Social History Main Topics  . Smoking status: Current Every Day Smoker -- 0.50 packs/day for 10 years    Types: Cigarettes  . Smokeless tobacco: Never Used  . Alcohol Use: No  . Drug Use: No  . Sexually Active: Yes    Birth Control/ Protection: Pill     Comment: generess fe   Other Topics Concern   . Not on file   Social History Narrative  . No narrative on file       Review of Systems     Objective:   Physical Exam   Results for orders placed in visit on 04/21/13  POCT CBC      Result Value Range   WBC 8.7  4.6 - 10.2 K/uL   Lymph, poc 3.3  0.6 - 3.4   POC LYMPH PERCENT 37.4  10 - 50 %L   MID (cbc) 0.6  0 - 0.9   POC MID % 6.6  0 - 12 %M   POC Granulocyte 4.9  2 - 6.9   Granulocyte percent 56.0  37 - 80 %G   RBC 4.62  4.04 - 5.48 M/uL   Hemoglobin 14.6  12.2 - 16.2 g/dL   HCT, POC 40.9  81.1 - 47.9 %   MCV 98.2 (*) 80 - 97 fL   MCH, POC 31.6 (*) 27 - 31.2 pg   MCHC 32.2  31.8 - 35.4 g/dL   RDW, POC 91.4     Platelet Count, POC 277  142 - 424 K/uL   MPV 9.8  0 - 99.8 fL        Assessment & Plan:  Brandi Burns is a 27 y.o. female Malaise and fatigue - Plan: POCT CBC, TSH, Vitamin D, 25-hydroxy, CK, FLUoxetine (PROZAC) 20 MG tablet  Adjustment disorder with mixed anxiety and depressed mood - Plan: FLUoxetine (PROZAC) 20 MG tablet  Myalgia  Insomnia  Weight gain  Likely component of depression/anxiety with fatigue, and nonadherence to SSRI. Will check TSH given weight gain, CK for myalgias.  Prior ESR noted. DDX fibromyalgia with various areas of involvement. Discussed sleep hygiene, coping techniques, other instructions below. recheck in 4-6 weeks, sooner if worse.   Meds ordered this encounter  Medications  . FLUoxetine (PROZAC) 20 MG tablet    Sig: Take 1 tablet (20 mg total) by mouth daily.    Dispense:  90 tablet    Refill:  0   Patient Instructions  Work on stress management techniques/relaxation techniques, and low intensity exercise such as walking.  Restart the Prozac once per day, work on the sleep hygiene we discussed, and recheck in the next 4-6 weeks. If you would like to meet with a counselor or therapist, I can provide some names for you.  You should receive a call or letter about your lab results within the next week to 10 days to  determine the results of the muscle enzyme test and thyroid test. Stretches of arms, neck, legs throughout the day, and if needed - ok to apply heat or ice to these areas. You can also take tylenol or advil intermittently if needed. Return to the clinic or go to the nearest emergency room if any of your symptoms worsen or new symptoms occur.

## 2013-04-21 NOTE — Patient Instructions (Addendum)
Work on Engineer, site, and low intensity exercise such as walking.  Restart the Prozac once per day, work on the sleep hygiene we discussed, and recheck in the next 4-6 weeks. If you would like to meet with a counselor or therapist, I can provide some names for you.  You should receive a call or letter about your lab results within the next week to 10 days to determine the results of the muscle enzyme test and thyroid test. Stretches of arms, neck, legs throughout the day, and if needed - ok to apply heat or ice to these areas. You can also take tylenol or advil intermittently if needed. Return to the clinic or go to the nearest emergency room if any of your symptoms worsen or new symptoms occur.

## 2013-04-23 LAB — VITAMIN D 25 HYDROXY (VIT D DEFICIENCY, FRACTURES): Vit D, 25-Hydroxy: 30 ng/mL (ref 30–89)

## 2013-04-24 NOTE — Progress Notes (Signed)
Left msg for pt to schedule 4-6 week f-up with Dr. Neva Seat.

## 2013-04-27 NOTE — Progress Notes (Signed)
Appt made for 06/11/13 with Dr. Neva Seat.

## 2013-06-11 ENCOUNTER — Ambulatory Visit: Payer: BC Managed Care – PPO | Admitting: Family Medicine

## 2013-06-29 ENCOUNTER — Ambulatory Visit
Admission: RE | Admit: 2013-06-29 | Discharge: 2013-06-29 | Disposition: A | Discharge status: Home / Self Care | Payer: BC Managed Care – PPO | Source: Ambulatory Visit | Attending: Endocrinology | Admitting: Endocrinology

## 2013-06-29 DIAGNOSIS — E049 Nontoxic goiter, unspecified: Secondary | ICD-10-CM

## 2013-07-02 ENCOUNTER — Other Ambulatory Visit: Payer: BC Managed Care – PPO

## 2013-09-27 IMAGING — US US SOFT TISSUE HEAD/NECK
1 series · 14 of 25 positions shown · non-contrast
Comparison: None.

CLINICAL DATA: Enlarged thyroid on physical exam

THYROID ULTRASOUND
TECHNIQUE: Ultrasound examination of the thyroid gland and adjacent
soft tissues was performed.

[Series 1: us soft tissue head/neck · 0.09mm/px · 14 of 43 slices shown]
[im 1/43]
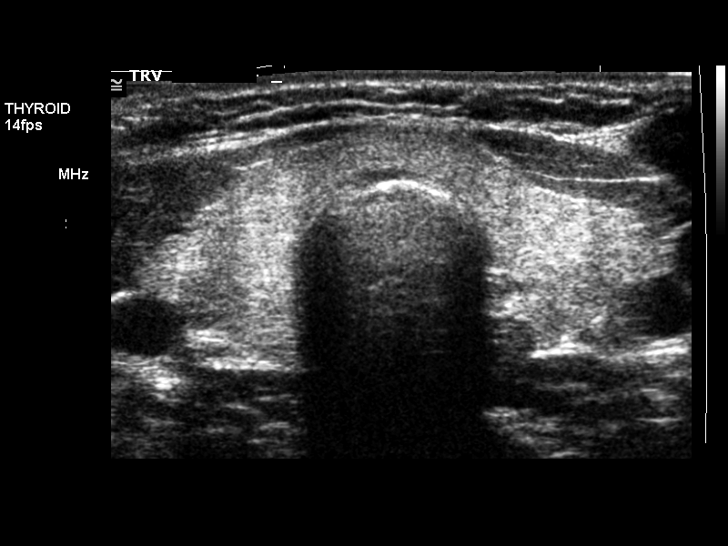
[im 4/43]
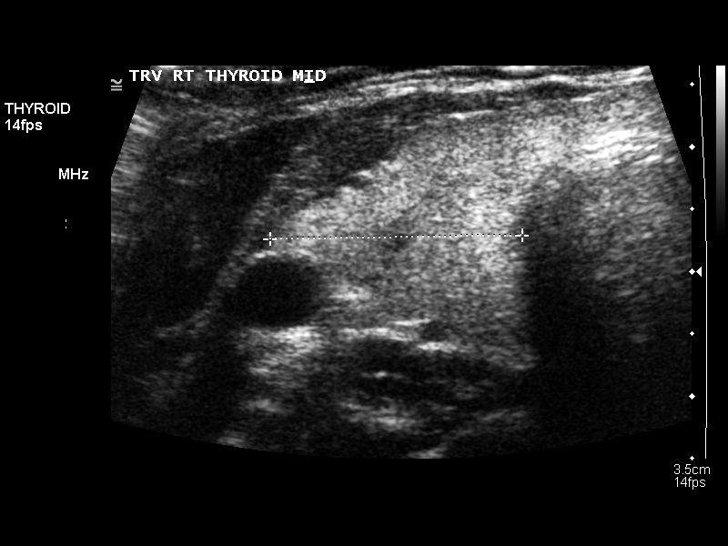
[im 8/43]
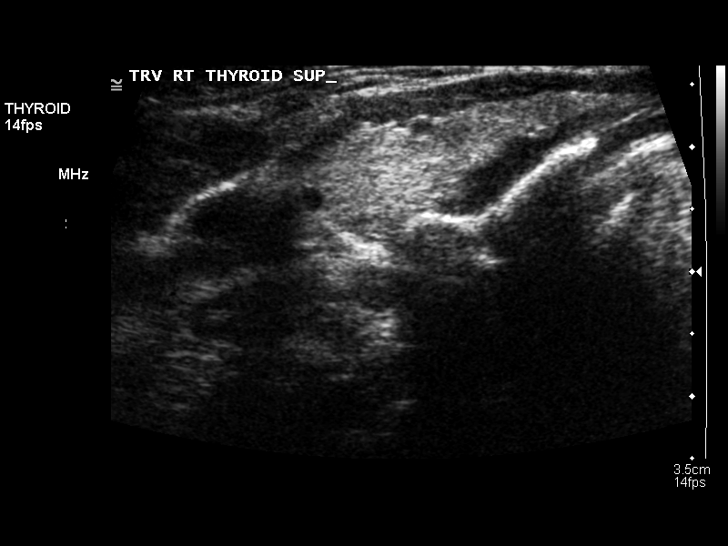
[im 11/43]
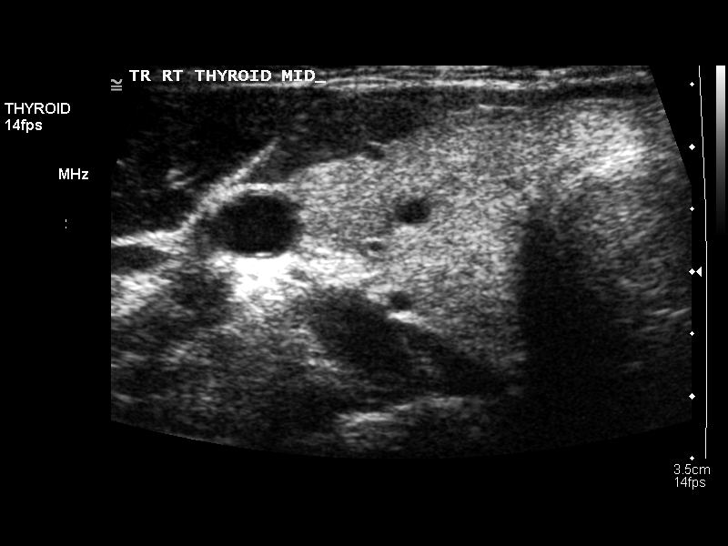
[im 15/43]
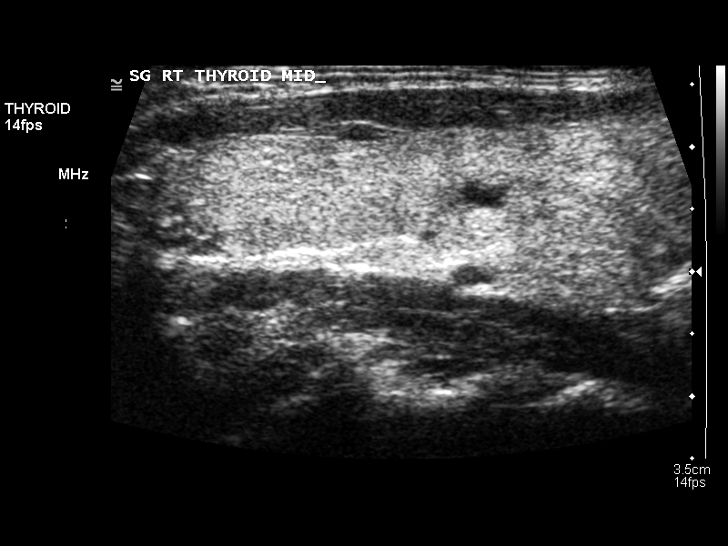
[im 16/43]
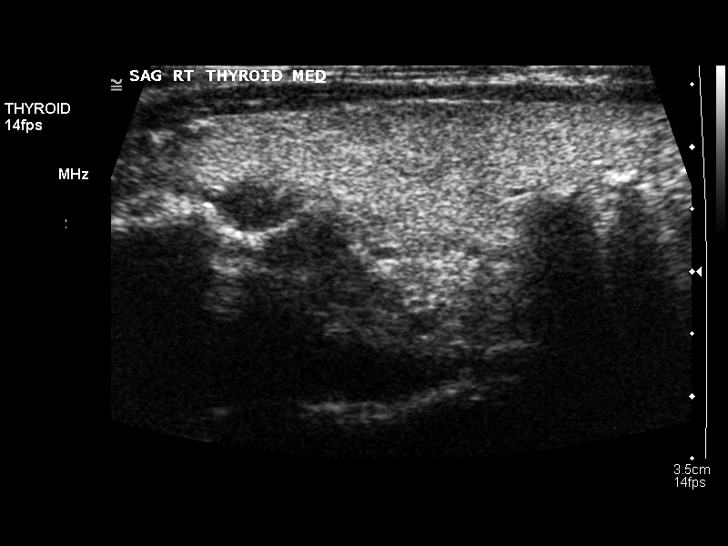
[im 20/43]
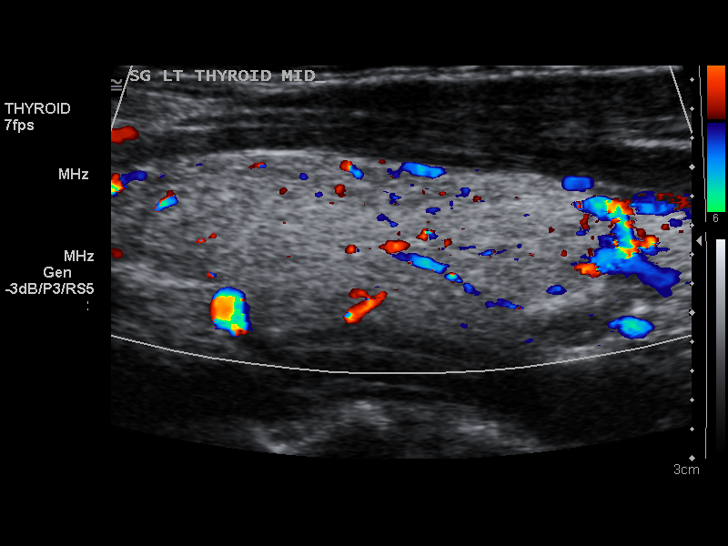
[im 23/43]
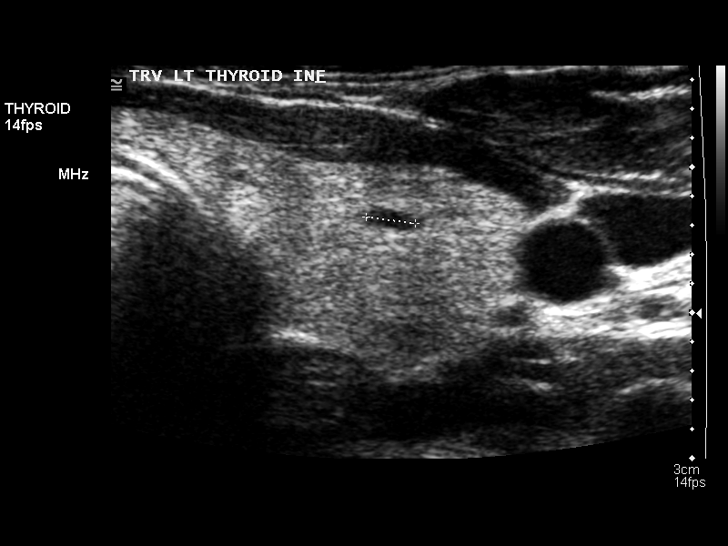
[im 27/43]
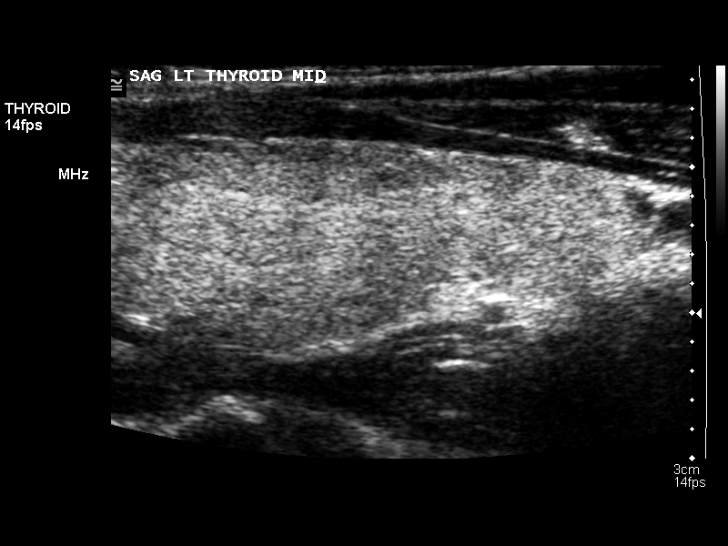
[im 29/43]
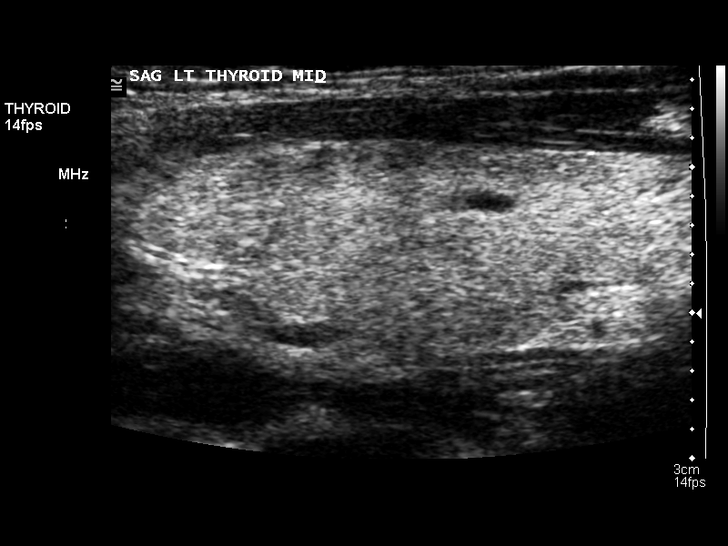
[im 32/43]
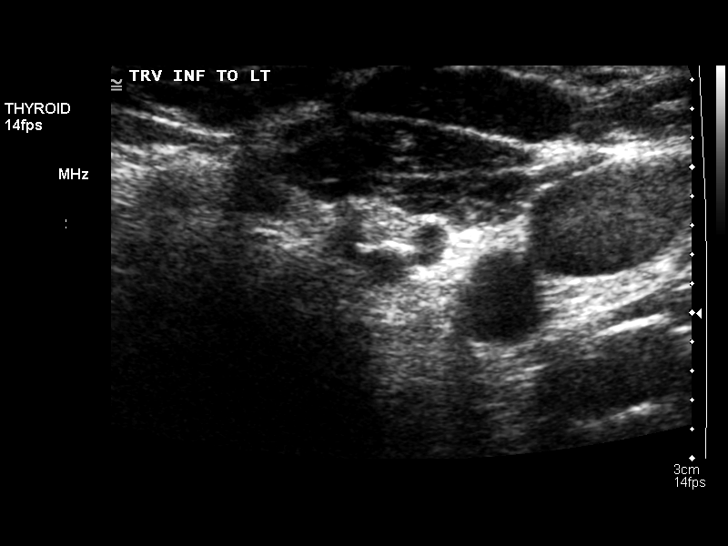
[im 36/43]
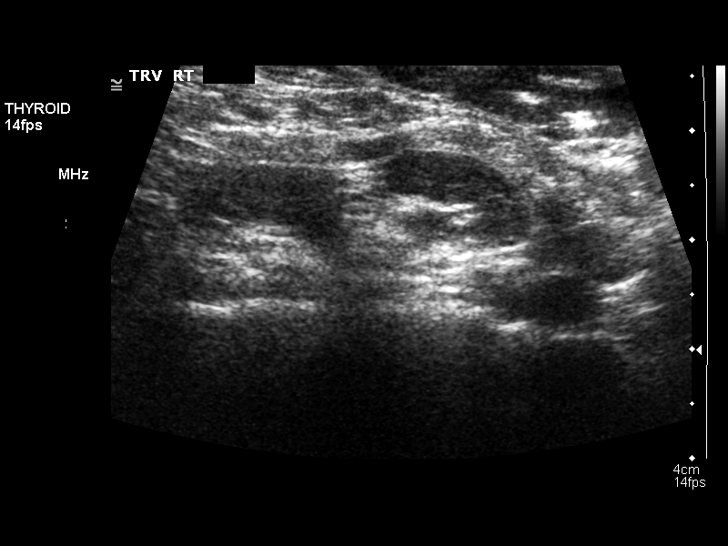
[im 39/43]
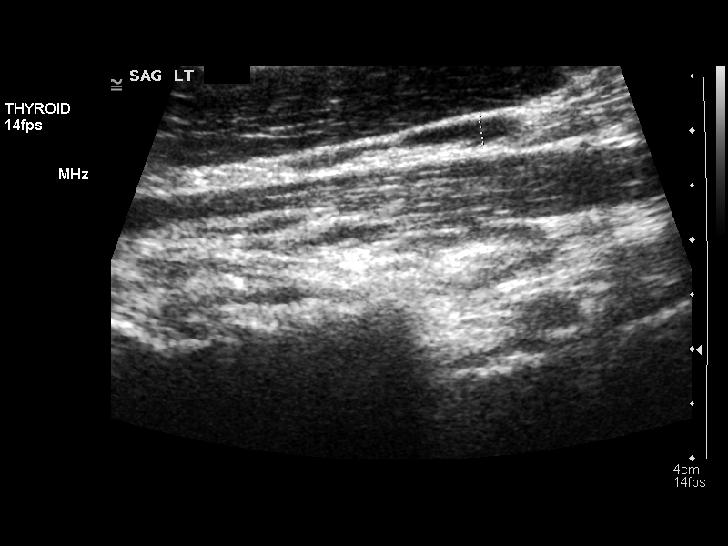
[im 43/43]
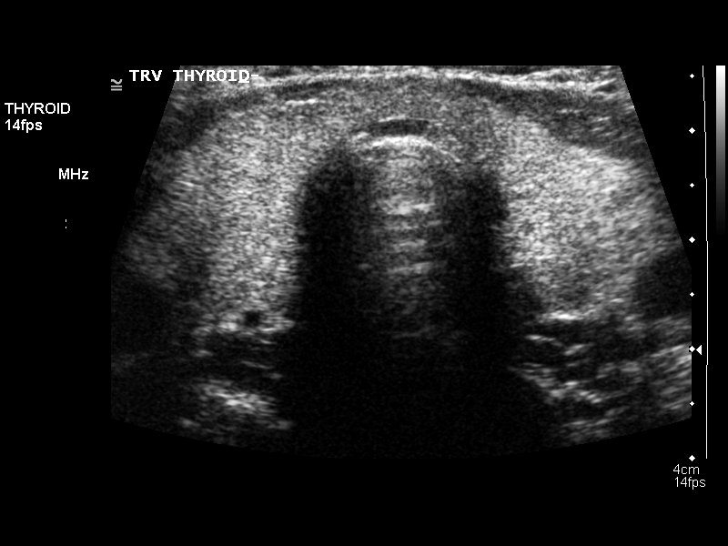

[14 of 25 positions shown; findings below may reference images not displayed]

FINDINGS: Right thyroid lobe:  6.4 x 1.3 x 2.0 cm.
Left thyroid lobe:  5.4 x 1.4 x 1.9 cm.
Isthmus:  4.7 mm in thickness.

Focal nodules:  The echogenicity of the thyroid tissue is slightly
inhomogeneous.  Only small nodules are present bilaterally of no
more than 3 mm in diameter.

Lymphadenopathy:  None visualized.
IMPRESSION: The thyroid gland is somewhat elongated and inhomogeneous.  Small
nodules are present bilaterally of no more than 3 mm in diameter.

## 2015-06-19 ENCOUNTER — Ambulatory Visit: Payer: Self-pay | Admitting: Internal Medicine

## 2015-06-19 DIAGNOSIS — Z0289 Encounter for other administrative examinations: Secondary | ICD-10-CM

## 2015-10-27 ENCOUNTER — Other Ambulatory Visit: Payer: Self-pay | Admitting: Endocrinology

## 2015-10-27 DIAGNOSIS — E042 Nontoxic multinodular goiter: Secondary | ICD-10-CM

## 2015-11-07 ENCOUNTER — Other Ambulatory Visit: Payer: Self-pay

## 2015-11-14 ENCOUNTER — Ambulatory Visit
Admission: RE | Admit: 2015-11-14 | Discharge: 2015-11-14 | Disposition: A | Discharge status: Home / Self Care | Payer: BLUE CROSS/BLUE SHIELD | Source: Ambulatory Visit | Attending: Endocrinology | Admitting: Endocrinology

## 2015-11-14 DIAGNOSIS — E042 Nontoxic multinodular goiter: Secondary | ICD-10-CM

## 2016-01-07 ENCOUNTER — Telehealth: Payer: Self-pay

## 2016-01-07 NOTE — Telephone Encounter (Signed)
You were recommended to pt by one of her co workers and she is wondering if you can take her on as a new patient.  Please advise

## 2016-01-08 NOTE — Telephone Encounter (Signed)
Pt informed

## 2016-01-08 NOTE — Telephone Encounter (Signed)
Unfortunately, I'm not able to accept any more new patients at this time.  I'm sorry! Thank you!  

## 2016-03-08 LAB — OB RESULTS CONSOLE ABO/RH: RH TYPE: POSITIVE

## 2016-03-08 LAB — OB RESULTS CONSOLE RPR: RPR: NONREACTIVE

## 2016-03-08 LAB — OB RESULTS CONSOLE RUBELLA ANTIBODY, IGM: RUBELLA: IMMUNE

## 2016-03-08 LAB — OB RESULTS CONSOLE HIV ANTIBODY (ROUTINE TESTING): HIV: NONREACTIVE

## 2016-03-08 LAB — OB RESULTS CONSOLE GC/CHLAMYDIA
CHLAMYDIA, DNA PROBE: NEGATIVE
Gonorrhea: NEGATIVE

## 2016-03-08 LAB — OB RESULTS CONSOLE HEPATITIS B SURFACE ANTIGEN: HEP B S AG: NEGATIVE

## 2016-03-08 LAB — OB RESULTS CONSOLE ANTIBODY SCREEN: ANTIBODY SCREEN: NEGATIVE

## 2016-05-05 ENCOUNTER — Other Ambulatory Visit: Payer: Self-pay | Admitting: Obstetrics and Gynecology

## 2016-05-05 DIAGNOSIS — R3129 Other microscopic hematuria: Secondary | ICD-10-CM

## 2016-05-10 ENCOUNTER — Other Ambulatory Visit: Payer: BLUE CROSS/BLUE SHIELD

## 2016-05-13 ENCOUNTER — Ambulatory Visit
Admission: RE | Admit: 2016-05-13 | Discharge: 2016-05-13 | Disposition: A | Discharge status: Home / Self Care | Payer: BLUE CROSS/BLUE SHIELD | Source: Ambulatory Visit | Attending: Obstetrics and Gynecology | Admitting: Obstetrics and Gynecology

## 2016-05-13 DIAGNOSIS — R3129 Other microscopic hematuria: Secondary | ICD-10-CM

## 2016-10-13 ENCOUNTER — Encounter (HOSPITAL_COMMUNITY): Payer: Self-pay | Admitting: *Deleted

## 2016-10-13 ENCOUNTER — Telehealth (HOSPITAL_COMMUNITY): Payer: Self-pay | Admitting: *Deleted

## 2016-10-13 NOTE — Telephone Encounter (Signed)
Preadmission screen  

## 2016-10-18 NOTE — H&P (Signed)
Brandi BayleyHeather M Burns is a 30 y.o. female presenting for IOL for GHTN. BP 140s/70s. OB History    Gravida Para Term Preterm AB Living   3 2 2     2    SAB TAB Ectopic Multiple Live Births           2     Past Medical History:  Diagnosis Date  . Abnormal Pap smear 2011  . Depression   . H/O thyroid nodule   . HPV in female   . Thyroid disease   . Vaginal Pap smear, abnormal    Past Surgical History:  Procedure Laterality Date  . CHOLECYSTECTOMY    . COLPOSCOPY     Family History: family history includes Aneurysm in her maternal grandmother; Diabetes in her maternal grandfather; Emphysema in her maternal grandmother; Heart disease in her maternal grandfather and maternal uncle; Hypertension in her maternal grandfather; Thyroid disease in her maternal grandmother. Social History:  reports that she has been smoking Cigarettes.  She has a 5.00 pack-year smoking history. She has never used smokeless tobacco. She reports that she does not drink alcohol or use drugs.     Maternal Diabetes: No Genetic Screening: Normal Maternal Ultrasounds/Referrals: Normal Fetal Ultrasounds or other Referrals:  None Maternal Substance Abuse:  No Significant Maternal Medications:  None Significant Maternal Lab Results:  None Other Comments:  None  Review of Systems  Eyes: Negative for blurred vision.  Gastrointestinal: Negative for abdominal pain.  Neurological: Negative for headaches.   Maternal Medical History:  Fetal activity: Perceived fetal activity is normal.        Last menstrual period 02/02/2016. Maternal Exam:  Abdomen: Fetal presentation: vertex     Physical Exam  Cardiovascular: Normal rate and regular rhythm.   Respiratory: Effort normal and breath sounds normal.  GI: Soft. There is no tenderness.  Neurological: She has normal reflexes.    Cx 1-2/th/soft/vtx  Prenatal labs: ABO, Rh: O/Positive/-- (04/17 0000) Antibody: Negative (04/17 0000) Rubella: Immune (04/17  0000) RPR: Nonreactive (04/17 0000)  HBsAg: Negative (04/17 0000)  HIV: Non-reactive (04/17 0000)  GBS:     Assessment/Plan: 30 yo G3P2 @ 37 0/7 weeks with GHTN Two stage induction D/W patient   Zygmunt Mcglinn II,Annalee Meyerhoff E 10/18/2016, 3:19 PM

## 2016-10-19 ENCOUNTER — Encounter (HOSPITAL_COMMUNITY): Payer: Self-pay

## 2016-10-19 ENCOUNTER — Inpatient Hospital Stay (HOSPITAL_COMMUNITY): Payer: BLUE CROSS/BLUE SHIELD | Admitting: Anesthesiology

## 2016-10-19 ENCOUNTER — Inpatient Hospital Stay (HOSPITAL_COMMUNITY)
Admission: RE | Admit: 2016-10-19 | Discharge: 2016-10-21 | DRG: 775 | Disposition: A | Discharge status: Home / Self Care | Payer: BLUE CROSS/BLUE SHIELD | Source: Ambulatory Visit | Attending: Obstetrics and Gynecology | Admitting: Obstetrics and Gynecology

## 2016-10-19 DIAGNOSIS — Z3A37 37 weeks gestation of pregnancy: Secondary | ICD-10-CM

## 2016-10-19 DIAGNOSIS — O99334 Smoking (tobacco) complicating childbirth: Secondary | ICD-10-CM | POA: Diagnosis present

## 2016-10-19 DIAGNOSIS — Z8249 Family history of ischemic heart disease and other diseases of the circulatory system: Secondary | ICD-10-CM

## 2016-10-19 DIAGNOSIS — F17210 Nicotine dependence, cigarettes, uncomplicated: Secondary | ICD-10-CM | POA: Diagnosis present

## 2016-10-19 DIAGNOSIS — O134 Gestational [pregnancy-induced] hypertension without significant proteinuria, complicating childbirth: Secondary | ICD-10-CM | POA: Diagnosis present

## 2016-10-19 DIAGNOSIS — O139 Gestational [pregnancy-induced] hypertension without significant proteinuria, unspecified trimester: Secondary | ICD-10-CM | POA: Diagnosis present

## 2016-10-19 DIAGNOSIS — Z833 Family history of diabetes mellitus: Secondary | ICD-10-CM

## 2016-10-19 DIAGNOSIS — Z3490 Encounter for supervision of normal pregnancy, unspecified, unspecified trimester: Secondary | ICD-10-CM

## 2016-10-19 LAB — ABO/RH: ABO/RH(D): O POS

## 2016-10-19 LAB — TYPE AND SCREEN
ABO/RH(D): O POS
Antibody Screen: NEGATIVE

## 2016-10-19 LAB — CBC
HCT: 32 % — ABNORMAL LOW (ref 36.0–46.0)
HEMOGLOBIN: 11.3 g/dL — AB (ref 12.0–15.0)
MCH: 31.5 pg (ref 26.0–34.0)
MCHC: 35.3 g/dL (ref 30.0–36.0)
MCV: 89.1 fL (ref 78.0–100.0)
PLATELETS: 244 10*3/uL (ref 150–400)
RBC: 3.59 MIL/uL — ABNORMAL LOW (ref 3.87–5.11)
RDW: 14 % (ref 11.5–15.5)
WBC: 8.3 10*3/uL (ref 4.0–10.5)

## 2016-10-19 LAB — RPR: RPR: NONREACTIVE

## 2016-10-19 MED ORDER — DIPHENHYDRAMINE HCL 50 MG/ML IJ SOLN: 12.5000 mg | INTRAMUSCULAR | Status: DC | PRN

## 2016-10-19 MED ORDER — EPHEDRINE 5 MG/ML INJ
10.0000 mg | INTRAVENOUS | Status: DC | PRN
  Filled 2016-10-19: qty 4

## 2016-10-19 MED ORDER — ACETAMINOPHEN 325 MG PO TABS: 650.0000 mg | ORAL_TABLET | ORAL | Status: DC | PRN

## 2016-10-19 MED ORDER — OXYCODONE-ACETAMINOPHEN 5-325 MG PO TABS: 2.0000 | ORAL_TABLET | ORAL | Status: DC | PRN

## 2016-10-19 MED ORDER — OXYTOCIN 40 UNITS IN LACTATED RINGERS INFUSION - SIMPLE MED
1.0000 m[IU]/min | INTRAVENOUS | Status: DC
  Administered 2016-10-19: 2 m[IU]/min via INTRAVENOUS

## 2016-10-19 MED ORDER — ZOLPIDEM TARTRATE 5 MG PO TABS: 5.0000 mg | ORAL_TABLET | Freq: Every evening | ORAL | Status: DC | PRN

## 2016-10-19 MED ORDER — LACTATED RINGERS IV SOLN: 500.0000 mL | INTRAVENOUS | Status: DC | PRN

## 2016-10-19 MED ORDER — EPHEDRINE 5 MG/ML INJ: 10.0000 mg | INTRAVENOUS | Status: DC | PRN

## 2016-10-19 MED ORDER — OXYTOCIN 40 UNITS IN LACTATED RINGERS INFUSION - SIMPLE MED
2.5000 [IU]/h | INTRAVENOUS | Status: DC
  Administered 2016-10-19: 2.5 [IU]/h via INTRAVENOUS
  Filled 2016-10-19: qty 1000

## 2016-10-19 MED ORDER — OXYCODONE-ACETAMINOPHEN 5-325 MG PO TABS: 1.0000 | ORAL_TABLET | ORAL | Status: DC | PRN

## 2016-10-19 MED ORDER — TERBUTALINE SULFATE 1 MG/ML IJ SOLN
0.2500 mg | Freq: Once | INTRAMUSCULAR | Status: DC | PRN
  Filled 2016-10-19: qty 1

## 2016-10-19 MED ORDER — LACTATED RINGERS IV SOLN
INTRAVENOUS | Status: DC
  Administered 2016-10-19 (×2): via INTRAVENOUS

## 2016-10-19 MED ORDER — FENTANYL 2.5 MCG/ML BUPIVACAINE 1/10 % EPIDURAL INFUSION (WH - ANES)
14.0000 mL/h | INTRAMUSCULAR | Status: DC | PRN
  Filled 2016-10-19: qty 100

## 2016-10-19 MED ORDER — ONDANSETRON HCL 4 MG/2ML IJ SOLN: 4.0000 mg | Freq: Four times a day (QID) | INTRAMUSCULAR | Status: DC | PRN

## 2016-10-19 MED ORDER — OXYTOCIN BOLUS FROM INFUSION
500.0000 mL | Freq: Once | INTRAVENOUS | Status: AC
  Administered 2016-10-19: 500 mL via INTRAVENOUS

## 2016-10-19 MED ORDER — LACTATED RINGERS IV SOLN
500.0000 mL | Freq: Once | INTRAVENOUS | Status: AC
  Administered 2016-10-19: 500 mL via INTRAVENOUS

## 2016-10-19 MED ORDER — LIDOCAINE HCL (PF) 1 % IJ SOLN
30.0000 mL | INTRAMUSCULAR | Status: DC | PRN
  Filled 2016-10-19: qty 30

## 2016-10-19 MED ORDER — PHENYLEPHRINE 40 MCG/ML (10ML) SYRINGE FOR IV PUSH (FOR BLOOD PRESSURE SUPPORT)
80.0000 ug | PREFILLED_SYRINGE | INTRAVENOUS | Status: DC | PRN
  Filled 2016-10-19: qty 5

## 2016-10-19 MED ORDER — LACTATED RINGERS IV SOLN: 500.0000 mL | Freq: Once | INTRAVENOUS | Status: DC

## 2016-10-19 MED ORDER — FENTANYL 2.5 MCG/ML BUPIVACAINE 1/10 % EPIDURAL INFUSION (WH - ANES)
14.0000 mL/h | INTRAMUSCULAR | Status: DC | PRN
  Administered 2016-10-19 (×2): 14 mL/h via EPIDURAL
  Filled 2016-10-19: qty 100

## 2016-10-19 MED ORDER — SOD CITRATE-CITRIC ACID 500-334 MG/5ML PO SOLN
30.0000 mL | ORAL | Status: DC | PRN
Start: 2016-10-19 — End: 2016-10-20

## 2016-10-19 MED ORDER — LIDOCAINE HCL (PF) 1 % IJ SOLN
INTRAMUSCULAR | Status: DC | PRN
  Administered 2016-10-19 (×2): 7 mL via EPIDURAL

## 2016-10-19 MED ORDER — MISOPROSTOL 25 MCG QUARTER TABLET
25.0000 ug | ORAL_TABLET | ORAL | Status: DC | PRN
  Administered 2016-10-19: 25 ug via VAGINAL
  Filled 2016-10-19: qty 1
  Filled 2016-10-19: qty 0.25

## 2016-10-19 MED ORDER — PHENYLEPHRINE 40 MCG/ML (10ML) SYRINGE FOR IV PUSH (FOR BLOOD PRESSURE SUPPORT)
80.0000 ug | PREFILLED_SYRINGE | INTRAVENOUS | Status: DC | PRN
  Filled 2016-10-19: qty 10
  Filled 2016-10-19: qty 5

## 2016-10-19 NOTE — Progress Notes (Signed)
Delivery Note At 9:12 PM a viable female was delivered via Vaginal, Spontaneous Delivery (Presentation:OA ;  ).  APGAR: , ; weight  .   Placenta status: , .  Cord:  with the following complications: .  Cord pH: not done  Anesthesia:   Episiotomy: None Lacerations:  First degree @ 6:00, not bleeding, not repaired Suture Repair: N/A Est. Blood Loss (mL):  250  Mom to postpartum.  Baby to Couplet care / Skin to Skin.  Lorie Melichar II,Bridgette Wolden E 10/19/2016, 9:23 PM

## 2016-10-19 NOTE — Anesthesia Procedure Notes (Signed)
Epidural Patient location during procedure: OB Start time: 10/19/2016 2:42 PM End time: 10/19/2016 2:46 PM  Staffing Anesthesiologist: Leilani AbleHATCHETT, Quinlyn Tep Performed: anesthesiologist   Preanesthetic Checklist Completed: patient identified, surgical consent, pre-op evaluation, timeout performed, IV checked, risks and benefits discussed and monitors and equipment checked  Epidural Patient position: sitting Prep: site prepped and draped and DuraPrep Patient monitoring: continuous pulse ox and blood pressure Approach: midline Location: L3-L4 Injection technique: LOR air  Needle:  Needle type: Tuohy  Needle gauge: 17 G Needle length: 9 cm and 9 Needle insertion depth: 5 cm cm Catheter type: closed end flexible Catheter size: 19 Gauge Catheter at skin depth: 10 cm Test dose: negative and Other  Assessment Sensory level: T9 Events: blood not aspirated, injection not painful, no injection resistance, negative IV test and no paresthesia  Additional Notes Reason for block:procedure for pain

## 2016-10-19 NOTE — Progress Notes (Signed)
Cx 2/60/-2/vtx FHT cat one UCs q3-4 min Pit @ 3ml No HA or vision change Lungs CTA Cor RRR Abd no epigastric tenderness DTR 2+  Vitals:   10/19/16 0700 10/19/16 0730  BP: 101/61 132/72  Pulse: 84 91  Resp: 18   Temp:     A/P: continue pitocin

## 2016-10-19 NOTE — Progress Notes (Signed)
Cx 3/70/-2 AROM clear FHT cat one UCs q2-3 min

## 2016-10-19 NOTE — Anesthesia Preprocedure Evaluation (Signed)
Anesthesia Evaluation  Patient identified by MRN, date of birth, ID band Patient awake    Reviewed: Allergy & Precautions, H&P , NPO status , Patient's Chart, lab work & pertinent test results  Airway Mallampati: I  TM Distance: >3 FB Neck ROM: full    Dental no notable dental hx.    Pulmonary asthma , Current Smoker,    Pulmonary exam normal        Cardiovascular negative cardio ROS Normal cardiovascular exam     Neuro/Psych negative neurological ROS     GI/Hepatic negative GI ROS, Neg liver ROS,   Endo/Other  negative endocrine ROS  Renal/GU negative Renal ROS     Musculoskeletal   Abdominal (+) + obese,   Peds  Hematology negative hematology ROS (+)   Anesthesia Other Findings   Reproductive/Obstetrics (+) Pregnancy                             Anesthesia Physical Anesthesia Plan  ASA: II  Anesthesia Plan: Epidural   Post-op Pain Management:    Induction:   Airway Management Planned:   Additional Equipment:   Intra-op Plan:   Post-operative Plan:   Informed Consent: I have reviewed the patients History and Physical, chart, labs and discussed the procedure including the risks, benefits and alternatives for the proposed anesthesia with the patient or authorized representative who has indicated his/her understanding and acceptance.     Plan Discussed with:   Anesthesia Plan Comments:         Anesthesia Quick Evaluation

## 2016-10-19 NOTE — Anesthesia Pain Management Evaluation Note (Signed)
  CRNA Pain Management Visit Note  Patient: Brandi Burns, 30 y.o., female  "Hello I am a member of the anesthesia team at [facility]. We have an anesthesia team available at all times to provide care throughout the hospital, including epidural management and anesthesia for C-section. I don't know your plan for the delivery whether it a natural birth, water birth, IV sedation, nitrous supplementation, doula or epidural, but we want to meet your pain goals."   1.Was your pain managed to your expectations on prior hospitalizations?   Yes   2.What is your expectation for pain management during this hospitalization?     Epidural  3.How can we help you reach that goal? Epidural  Record the patient's initial score and the patient's pain goal.   Pain: 2  Pain Goal: 7 [facility] wants you to be able to say your pain was always managed very well.  [name] 10/19/2016

## 2016-10-20 DIAGNOSIS — O139 Gestational [pregnancy-induced] hypertension without significant proteinuria, unspecified trimester: Secondary | ICD-10-CM | POA: Diagnosis present

## 2016-10-20 LAB — CBC
HEMATOCRIT: 30.8 % — AB (ref 36.0–46.0)
HEMOGLOBIN: 10.7 g/dL — AB (ref 12.0–15.0)
MCH: 31.3 pg (ref 26.0–34.0)
MCHC: 34.7 g/dL (ref 30.0–36.0)
MCV: 90.1 fL (ref 78.0–100.0)
Platelets: 212 10*3/uL (ref 150–400)
RBC: 3.42 MIL/uL — AB (ref 3.87–5.11)
RDW: 14.1 % (ref 11.5–15.5)
WBC: 14 10*3/uL — ABNORMAL HIGH (ref 4.0–10.5)

## 2016-10-20 MED ORDER — OXYCODONE HCL 5 MG PO TABS: 10.0000 mg | ORAL_TABLET | ORAL | Status: DC | PRN

## 2016-10-20 MED ORDER — PRENATAL MULTIVITAMIN CH
1.0000 | ORAL_TABLET | Freq: Every day | ORAL | Status: DC
  Administered 2016-10-20: 1 via ORAL
  Filled 2016-10-20: qty 1

## 2016-10-20 MED ORDER — SOD CITRATE-CITRIC ACID 500-334 MG/5ML PO SOLN: 30.0000 mL | ORAL | Status: DC | PRN

## 2016-10-20 MED ORDER — BENZOCAINE-MENTHOL 20-0.5 % EX AERO: 1.0000 "application " | INHALATION_SPRAY | CUTANEOUS | Status: DC | PRN

## 2016-10-20 MED ORDER — DIBUCAINE 1 % RE OINT: 1.0000 "application " | TOPICAL_OINTMENT | RECTAL | Status: DC | PRN

## 2016-10-20 MED ORDER — DIPHENHYDRAMINE HCL 25 MG PO CAPS: 25.0000 mg | ORAL_CAPSULE | Freq: Four times a day (QID) | ORAL | Status: DC | PRN

## 2016-10-20 MED ORDER — OXYTOCIN 40 UNITS IN LACTATED RINGERS INFUSION - SIMPLE MED: 2.5000 [IU]/h | INTRAVENOUS | Status: DC

## 2016-10-20 MED ORDER — LIDOCAINE HCL (PF) 1 % IJ SOLN
30.0000 mL | INTRAMUSCULAR | Status: DC | PRN
  Filled 2016-10-20: qty 30

## 2016-10-20 MED ORDER — OXYTOCIN BOLUS FROM INFUSION: 500.0000 mL | Freq: Once | INTRAVENOUS | Status: DC

## 2016-10-20 MED ORDER — ONDANSETRON HCL 4 MG/2ML IJ SOLN: 4.0000 mg | Freq: Four times a day (QID) | INTRAMUSCULAR | Status: DC | PRN

## 2016-10-20 MED ORDER — FLEET ENEMA 7-19 GM/118ML RE ENEM: 1.0000 | ENEMA | RECTAL | Status: DC | PRN

## 2016-10-20 MED ORDER — ONDANSETRON HCL 4 MG/2ML IJ SOLN: 4.0000 mg | INTRAMUSCULAR | Status: DC | PRN

## 2016-10-20 MED ORDER — ONDANSETRON HCL 4 MG PO TABS: 4.0000 mg | ORAL_TABLET | ORAL | Status: DC | PRN

## 2016-10-20 MED ORDER — LACTATED RINGERS IV SOLN: INTRAVENOUS | Status: DC

## 2016-10-20 MED ORDER — SIMETHICONE 80 MG PO CHEW: 80.0000 mg | CHEWABLE_TABLET | ORAL | Status: DC | PRN

## 2016-10-20 MED ORDER — COCONUT OIL OIL
1.0000 "application " | TOPICAL_OIL | Status: DC | PRN
  Administered 2016-10-20: 1 via TOPICAL
  Filled 2016-10-20: qty 120

## 2016-10-20 MED ORDER — ACETAMINOPHEN 325 MG PO TABS: 650.0000 mg | ORAL_TABLET | ORAL | Status: DC | PRN

## 2016-10-20 MED ORDER — OXYCODONE HCL 5 MG PO TABS: 5.0000 mg | ORAL_TABLET | ORAL | Status: DC | PRN

## 2016-10-20 MED ORDER — WITCH HAZEL-GLYCERIN EX PADS: 1.0000 "application " | MEDICATED_PAD | CUTANEOUS | Status: DC | PRN

## 2016-10-20 MED ORDER — SENNOSIDES-DOCUSATE SODIUM 8.6-50 MG PO TABS
2.0000 | ORAL_TABLET | ORAL | Status: DC
  Administered 2016-10-20: 2 via ORAL
  Filled 2016-10-20: qty 2

## 2016-10-20 MED ORDER — BUTORPHANOL TARTRATE 1 MG/ML IJ SOLN: 1.0000 mg | INTRAMUSCULAR | Status: DC | PRN

## 2016-10-20 MED ORDER — TETANUS-DIPHTH-ACELL PERTUSSIS 5-2.5-18.5 LF-MCG/0.5 IM SUSP: 0.5000 mL | Freq: Once | INTRAMUSCULAR | Status: DC

## 2016-10-20 MED ORDER — LACTATED RINGERS IV SOLN: 500.0000 mL | INTRAVENOUS | Status: DC | PRN

## 2016-10-20 MED ORDER — ZOLPIDEM TARTRATE 5 MG PO TABS: 5.0000 mg | ORAL_TABLET | Freq: Every evening | ORAL | Status: DC | PRN

## 2016-10-20 MED ORDER — IBUPROFEN 600 MG PO TABS
600.0000 mg | ORAL_TABLET | Freq: Four times a day (QID) | ORAL | Status: DC
  Administered 2016-10-20 – 2016-10-21 (×6): 600 mg via ORAL
  Filled 2016-10-20 (×6): qty 1

## 2016-10-20 NOTE — Progress Notes (Signed)
MOB was referred for history of depression/anxiety. * Referral screened out by Clinical Social Worker because none of the following criteria appear to apply: ~ History of anxiety/depression during this pregnancy, or of post-partum depression. ~ Diagnosis of anxiety and/or depression within last 3 years OR * MOB's symptoms currently being treated with medication and/or therapy.  CSW acknowledged consult and met with MOB.  MOB reported no signs or symptoms of depression since teenage years.  CSW educated MOB about PPD. CSW informed the MOB of possible supports and interventions to decrease PPD.  CSW also encouraged MOB to seek medical attention if needed for increased signs and symptoms for PPD. No other psychosocial concerns noted.   Please contact the Clinical Social Worker if needs arise, or if MOB requests.  Jozie Wulf Boyd-Gilyard, MSW, LCSW Clinical Social Work [phone]   

## 2016-10-20 NOTE — Anesthesia Postprocedure Evaluation (Signed)
Anesthesia Post Note  Patient: Brandi Burns  Procedure(s) Performed: * No procedures listed *  Patient location during evaluation: Mother Baby Anesthesia Type: Epidural Level of consciousness: awake and alert and oriented Pain management: satisfactory to patient Vital Signs Assessment: post-procedure vital signs reviewed and stable Respiratory status: spontaneous breathing and nonlabored ventilation Cardiovascular status: stable Postop Assessment: no headache, no backache, no signs of nausea or vomiting, adequate PO intake and patient able to bend at knees (patient up walking) Anesthetic complications: no     Last Vitals:  Vitals:   10/20/16 0100 10/20/16 0610  BP: 125/70 118/60  Pulse: 75 69  Resp: 18 18  Temp: 36.7 C 36.7 C    Last Pain:  Vitals:   10/20/16 0610  TempSrc: Oral  PainSc: 0-No pain   Pain Goal: Patients Stated Pain Goal: 4 (10/19/16 1000)               Madison HickmanGREGORY,Brielle Moro

## 2016-10-20 NOTE — Lactation Note (Signed)
This note was copied from a baby's chart. Lactation Consultation Note Mom has 30 yr old and 336 yr old that she attempted to Bf while in hospital but was unable to d/t flat nipples. RN called LC and notifyied of flat nipples and unable to latch baby. Suggested to give hand pump and shells to wear until LC can see pt.  fitted mom w/#16 NS, has small red flat nipples. Rt. Areola has bruised hicky on areola, and has white tissue, may be blisters to area. Fitted mom w/#16 NS. Encouraged to use "C" hold when latching. Taught application and care of NS. Mom encouraged to feed baby 8-12 times/24 hours and with feeding cues. Referred to Baby and Me Book in Breastfeeding section Pg. 22-23 for position options and Proper latch demonstration. Hand expression taught w/colostrum noted. Newborn behavior and activity educated. encoruaged STS, I&O, supply and demand.  Even though baby is 37 1/7 weeks, reviewed LPI information just in case baby gets sleepy and doesn't want to feed. Supplementation guide lines for supplementing and care.  Assisted in football hold, baby BF for approx. 5 min. WH/LC brochure given w/resources, support groups and LC services. Patient Name: Brandi Darien RamusHeather Kriesel LKGMW'NToday's Date: 10/20/2016 Reason for consult: Initial assessment   Maternal Data Has patient been taught Hand Expression?: Yes Does the patient have breastfeeding experience prior to this delivery?: No  Feeding Feeding Type: Breast Fed Length of feed: 5 min  LATCH Score/Interventions Latch: Repeated attempts needed to sustain latch, nipple held in mouth throughout feeding, stimulation needed to elicit sucking reflex. Intervention(s): Adjust position;Assist with latch;Breast massage;Breast compression  Audible Swallowing: None Intervention(s): Skin to skin;Hand expression  Type of Nipple: Flat Intervention(s): Hand pump;Shells  Comfort (Breast/Nipple): Filling, red/small blisters or bruises, mild/mod discomfort      Hold (Positioning): Full assist, staff holds infant at breast Intervention(s): Breastfeeding basics reviewed;Support Pillows;Position options;Skin to skin  LATCH Score: 3  Lactation Tools Discussed/Used Tools: Shells;Nipple Dorris CarnesShields;Pump Nipple shield size: 16 Shell Type: Inverted Breast pump type: Manual Pump Review: Setup, frequency, and cleaning Initiated by:: Elenore RotaAlyssa Pugleasa RN Date initiated:: 10/20/16   Consult Status Consult Status: Follow-up Date: 10/20/16 (in pm) Follow-up type: In-patient    Charyl DancerCARVER, Danelly Hassinger G 10/20/2016, 3:47 AM

## 2016-10-20 NOTE — Plan of Care (Signed)
Problem: Activity: Goal: Ability to tolerate increased activity will improve Outcome: Completed/Met Date Met: 10/20/16 Pt ambulating frequently and independently in the room.  Encouraged ambulation in hallways  Problem: Urinary Elimination: Goal: Ability to reestablish a normal urinary elimination pattern will improve Outcome: Completed/Met Date Met: 10/20/16 Pt voiding without difficulty.

## 2016-10-20 NOTE — Progress Notes (Signed)
Post Partum Day 1 Subjective: no complaints, up ad lib, voiding, tolerating PO and + flatus  Objective: Blood pressure 118/60, pulse 69, temperature 98 F (36.7 C), temperature source Oral, resp. rate 18, height 5\' 5"  (1.651 m), weight 186 lb 8 oz (84.6 kg), last menstrual period 02/02/2016, SpO2 99 %, unknown if currently breastfeeding.  Physical Exam:  General: alert and cooperative Lochia: appropriate Uterine Fundus: firm Incision: healing well DVT Evaluation: No evidence of DVT seen on physical exam. Negative Homan's sign. No cords or calf tenderness. No significant calf/ankle edema.   Recent Labs  10/19/16 0100 10/20/16 0454  HGB 11.3* 10.7*  HCT 32.0* 30.8*    Assessment/Plan: Plan for discharge tomorrow   LOS: 1 day   Amina Menchaca G 10/20/2016, 8:14 AM

## 2016-10-21 NOTE — Lactation Note (Signed)
This note was copied from a baby's chart. Lactation Consultation Note  Patient Name: Brandi Burns UJWJX'BToday's Date: 10/21/2016 Reason for consult: Follow-up assessment   With this mom and baby, 37 3/7 weeks CGA, now 5436 hours old. The baby may have some lingual restriction. Tongue sits far back in mouth, and mom has sore nipples from biting during feeding.Tongue tie resources given to parents.  Mom using nipple shield, and baby has normal wet and dirty diapers, but  Just enough. Baby hungry after feeding Mom pumping, and encouraged to supplement baby with her EBm. Mom has Madela DEP at home - advised mom to try and pump every 3 hours for 15 minutes, and to feed baby at least every 3 hours. Baby may be cluster feeding now.Mom told that  If cluster feeding continues past the next 24 hours, than the baby is probably not transferring enough at the breast, and supplementation is needed. I reviewed with parents the normal amounts of wet and dirty diapers, and what a deep latch looks and feels like, as opposed to a shallow latch. Baby and Me book pages used to demonstrate this also. O/P lactation appointment made for 12/7, and mom knows to call for questions/conerns.    Maternal Data    Feeding Feeding Type: Breast Fed Length of feed: 10 min  LATCH Score/Interventions Latch: Grasps breast easily, tongue down, lips flanged, rhythmical sucking.  Audible Swallowing: A few with stimulation  Type of Nipple: Flat Intervention(s): Hand pump;Shells  Comfort (Breast/Nipple): Filling, red/small blisters or bruises, mild/mod discomfort Problem noted: Cracked, bleeding, blisters, bruises Intervention(s): Expressed breast milk to nipple;Double electric pump  Problem noted: Mild/Moderate discomfort Interventions  (Cracked/bleeding/bruising/blister): Expressed breast milk to nipple;Hand pump Interventions (Mild/moderate discomfort): Comfort gels  Hold (Positioning): No assistance needed to correctly  position infant at breast.  LATCH Score: 7  Lactation Tools Discussed/Used Tools: Comfort gels Nipple shield size: 20   Consult Status Consult Status: Complete Date: 10/28/16 Follow-up type: Out-patient    Brandi Burns, Brandi Burns 10/21/2016, 9:14 AM

## 2016-10-21 NOTE — Lactation Note (Addendum)
This note was copied from a baby's chart. Lactation Consultation Note Follow up visit at 26 hours of age.  RN requests comfort gels for mom due to sore nipples.  Rn set up DEBP.  Mom pumping now with bruising noted to center of left nipple with some skin abrasions.  Right nipple WNL, mom reports as sore and a small bruise from early latch off center areola.   LC fit mom with #20 NS, better fit, #16 is blanching base of nipple and pinching per mom.  Baby in crib while mom is pumping, unable to latch at this time.  [facility]  LC allowed baby to suck on gloved finger.  Baby is biting and noted to have high palate.  Upper gum ridge sides noted to feel rough possibly causing pain with latch.  NS should help with improved fit.   LC gave mom comfort gels and encouraged mom to not use coconut oil while using.  LC discussed with RN, Brandi Burns, to plan on supplementing with formula if mom is not expressing several mls with each pumping.  LC advised ok to use bottle nipple due to NS use and oral assessment.   LC to follow tomorrow.    Patient Name: Brandi Burns WUJWJ'XToday's Date: 10/21/2016 Reason for consult: Follow-up assessment;Breast/nipple pain   Maternal Data    Feeding    LATCH Score/Interventions                      Lactation Tools Discussed/Used Initiated by:: RN   Consult Status Consult Status: Follow-up Date: 10/21/16    Jannifer RodneyShoptaw, Brandi Burns 10/21/2016, 12:06 AM

## 2016-10-21 NOTE — Discharge Summary (Signed)
Obstetric Discharge Summary Reason for Admission: induction of labor Prenatal Procedures: ultrasound Intrapartum Procedures: spontaneous vaginal delivery Postpartum Procedures: none Complications-Operative and Postpartum: 1 degree perineal laceration Hemoglobin  Date Value Ref Range Status  10/20/2016 10.7 (L) 12.0 - 15.0 g/dL Final   HCT  Date Value Ref Range Status  10/20/2016 30.8 (L) 36.0 - 46.0 % Final    Physical Exam:  General: alert and cooperative Lochia: appropriate Uterine Fundus: firm Incision: healing well DVT Evaluation: No evidence of DVT seen on physical exam. Negative Homan's sign. No cords or calf tenderness. No significant calf/ankle edema.  Discharge Diagnoses: Term Pregnancy-delivered  Discharge Information: Date: 10/21/2016 Activity: pelvic rest Diet: routine Medications: PNV and Ibuprofen Condition: stable Instructions: refer to practice specific booklet Discharge to: home   Newborn Data: Live born female  Birth Weight: 6 lb 13.2 oz (3095 g) APGAR: 8, 9  Home with mother.  CURTIS,CAROL G 10/21/2016, 8:08 AM

## 2016-10-28 ENCOUNTER — Ambulatory Visit (HOSPITAL_COMMUNITY): Admit: 2016-10-28 | Payer: BLUE CROSS/BLUE SHIELD

## 2016-11-29 LAB — HM PAP SMEAR: HM PAP: NEGATIVE

## 2017-06-03 ENCOUNTER — Ambulatory Visit (INDEPENDENT_AMBULATORY_CARE_PROVIDER_SITE_OTHER): Payer: BLUE CROSS/BLUE SHIELD | Admitting: Physician Assistant

## 2017-06-03 ENCOUNTER — Encounter: Payer: Self-pay | Admitting: Physician Assistant

## 2017-06-03 VITALS — BP 100/62 | HR 66 | Temp 98.4°F | Resp 14 | Ht 66.0 in | Wt 170.0 lb

## 2017-06-03 DIAGNOSIS — Z8249 Family history of ischemic heart disease and other diseases of the circulatory system: Secondary | ICD-10-CM | POA: Diagnosis not present

## 2017-06-03 DIAGNOSIS — E041 Nontoxic single thyroid nodule: Secondary | ICD-10-CM | POA: Diagnosis not present

## 2017-06-03 DIAGNOSIS — F411 Generalized anxiety disorder: Secondary | ICD-10-CM

## 2017-06-03 LAB — TSH: TSH: 1.04 mIU/L

## 2017-06-03 MED ORDER — CITALOPRAM HYDROBROMIDE 10 MG PO TABS: 10.0000 mg | ORAL_TABLET | Freq: Every day | ORAL | 1 refills | Status: DC

## 2017-06-03 NOTE — Assessment & Plan Note (Signed)
Will repeat TSH today. Will obtain prior records to determine follow-up.

## 2017-06-03 NOTE — Assessment & Plan Note (Signed)
TSH today. Start Citalopram 10 mg daily. Increase to 20 mg in 2 weeks if tolerating well. Close follow-up scheduled. Alarm signs/symptoms reviewed that would prompt ER assessment.

## 2017-06-03 NOTE — Patient Instructions (Signed)
Please go to the lab for blood work.  I will call you with your results.  Please start the citalopram taking one tablet daily After two weeks, can increase to 20 mg daily. If you are tolerating this well, give me a call and we will alter prescriptions sent in.   Follow-up with me in 4 weeks.  We can do your complete physical examination at that time.   If you note any worsening mood on the medication, stop it and call me.

## 2017-06-03 NOTE — Assessment & Plan Note (Signed)
EKG today with sinus bradycardia. Asymptomatic. HR ranging 58-68 during visit. Will check lipids, BMP etc at CPE. May need stress testing giving family history.

## 2017-06-03 NOTE — Progress Notes (Signed)
Pre visit review using our clinic review tool, if applicable. No additional management support is needed unless otherwise documented below in the visit note. 

## 2017-06-03 NOTE — Progress Notes (Signed)
Patient presents to clinic today to establish care.  Acute Concerns: Endorses feelings of anxiety, worsening over the past month. Notes she is a worry wart, stressing about most every thing. Endorses financial stressors. Endorses panic attacks recently with palpitations and jitteriness, lasting about 30 minutes each time. Endorses these are occurring about once-twice per week. Endorses some chest tightness during these episodes. Notes occasional depressed mood but this is rare. Notes poor sleep but has a 7 month at home and feels this is the reason. Notes stable diet. Denies SI/HI. Has a prior history of Depression for which she was prescribed.   Is not breast-feeding.   Chronic Issues: Thyroid Nodules -- Diagnosed several years prior. Has always had normal thyroid function. Is getting serial [place] -- last in May 2017. Was performed by her GYN..   Health Maintenance: Immunizations -- PAP -- Is followed by GYN -- Dr. Velvet Bathe. Last PAP 2016 -- Normal per patient.    Past Medical History:  Diagnosis Date  . Abnormal Pap smear 2011  . Anxiety   . Depression   . GERD (gastroesophageal reflux disease)    during pregnancy  . H/O thyroid nodule   . HPV in female   . Thyroid disease    Nodules  . Vaginal Pap smear, abnormal     Past Surgical History:  Procedure Laterality Date  . CHOLECYSTECTOMY  2011  . COLPOSCOPY      No current outpatient prescriptions on file prior to visit.   No current facility-administered medications on file prior to visit.     Allergies  Allergen Reactions  . Wellbutrin [Bupropion Hcl] Hives    Family History  Problem Relation Age of Onset  . Heart disease Maternal Uncle   . Heart attack Maternal Uncle 30  . Emphysema Maternal Grandmother        Smoker  . Thyroid disease Maternal Grandmother   . Aneurysm Maternal Grandmother        AAA -- s/p 2 surgical procedures  . Hyperlipidemia Maternal Grandmother   . Cancer Maternal Grandmother    Skin  . Heart disease Maternal Grandfather        5 bypass  . Diabetes Maternal Grandfather   . Hypertension Maternal Grandfather   . Healthy Son   . ADD / ADHD Son   . Anemia Son   . Asthma Son   . Healthy Daughter     Social History   Social History  . Marital status: Married    Spouse name: N/A  . Number of children: N/A  . Years of education: N/A   Occupational History  . Not on file.   Social History Main Topics  . Smoking status: Former Smoker    Packs/day: 0.50    Years: 10.00    Types: Cigarettes    Quit date: 09/23/2015  . Smokeless tobacco: Never Used  . Alcohol use No  . Drug use: No  . Sexual activity: Yes    Birth control/ protection: IUD     Comment: Mirena   Other Topics Concern  . Not on file   Social History Narrative  . No narrative on file   Review of Systems  Constitutional: Negative for fever and weight loss.  HENT: Negative for ear discharge, ear pain, hearing loss and tinnitus.   Eyes: Negative for blurred vision, double vision, photophobia and pain.  Respiratory: Negative for cough and shortness of breath.   Cardiovascular: Negative for chest pain and palpitations.  Gastrointestinal: Negative for  abdominal pain, blood in stool, constipation, diarrhea, heartburn, melena, nausea and vomiting.  Genitourinary: Negative for dysuria, flank pain, frequency, hematuria and urgency.  Musculoskeletal: Negative for falls.  Neurological: Negative for dizziness, loss of consciousness and headaches.  Endo/Heme/Allergies: Negative for environmental allergies.  Psychiatric/Behavioral: Negative for depression, hallucinations, substance abuse and suicidal ideas. The patient is nervous/anxious. The patient does not have insomnia.    BP 100/62   Pulse 66   Temp 98.4 F (36.9 C) (Oral)   Resp 14   Ht 5\' 6"  (1.676 m)   Wt 170 lb (77.1 kg)   LMP 05/30/2017   SpO2 98%   Breastfeeding? No   BMI 27.44 kg/m   Physical Exam  Constitutional: She is  oriented to person, place, and time and well-developed, well-nourished, and in no distress.  HENT:  Head: Normocephalic and atraumatic.  Eyes: Conjunctivae are normal.  Neck: Neck supple.  Cardiovascular: Normal rate, regular rhythm, normal heart sounds and intact distal pulses.   Pulmonary/Chest: No respiratory distress. She has no wheezes. She has no rales. She exhibits no tenderness.  Neurological: She is alert and oriented to person, place, and time.  Skin: Skin is warm and dry. No rash noted.  Psychiatric: Affect normal.  Vitals reviewed.  Assessment/Plan: Thyroid nodule Will repeat TSH today. Will obtain prior records to determine follow-up.   Generalized anxiety disorder TSH today. Start Citalopram 10 mg daily. Increase to 20 mg in 2 weeks if tolerating well. Close follow-up scheduled. Alarm signs/symptoms reviewed that would prompt ER assessment.   Family history of early CAD EKG today with sinus bradycardia. Asymptomatic. HR ranging 58-68 during visit. Will check lipids, BMP etc at CPE. May need stress testing giving family history.     Piedad ClimesMartin, Jazzy Parmer Cody, PA-C

## 2017-06-16 ENCOUNTER — Encounter: Payer: Self-pay | Admitting: Physician Assistant

## 2017-06-20 ENCOUNTER — Telehealth: Payer: Self-pay | Admitting: Physician Assistant

## 2017-06-20 MED ORDER — CITALOPRAM HYDROBROMIDE 20 MG PO TABS: 20.0000 mg | ORAL_TABLET | Freq: Every day | ORAL | 5 refills | Status: DC

## 2017-06-20 NOTE — Telephone Encounter (Signed)
Pt states that she was able to increase her medication as needed, pt asking for [place] new [place] [facility] [place]

## 2017-06-20 NOTE — Telephone Encounter (Signed)
Per 06/03/17 office note, pt was started on 10mg  and was advised to increase to 20mg  in 2 weeks if needed. Attempted to verify current dose with pt and left message for pt to call back and let us know what mg she is taking per day. Pt has f/u on 07/08/17.

## 2017-06-20 NOTE — Telephone Encounter (Signed)
Pt returned my call and states she is at 20mg  daily and has been tolerating dose well. Would like Rx for 20mg  once daily. Rx sent for #30 x no refills.

## 2017-07-04 ENCOUNTER — Encounter: Payer: Self-pay | Admitting: Physician Assistant

## 2017-07-04 MED ORDER — SERTRALINE HCL 50 MG PO TABS: 50.0000 mg | ORAL_TABLET | Freq: Every day | ORAL | 3 refills | Status: DC

## 2017-07-08 ENCOUNTER — Ambulatory Visit: Payer: BLUE CROSS/BLUE SHIELD | Admitting: Physician Assistant

## 2017-07-15 ENCOUNTER — Ambulatory Visit: Payer: Self-pay | Admitting: Physician Assistant

## 2017-07-21 ENCOUNTER — Other Ambulatory Visit: Payer: Self-pay | Admitting: Obstetrics and Gynecology

## 2017-07-21 DIAGNOSIS — E041 Nontoxic single thyroid nodule: Secondary | ICD-10-CM

## 2017-07-29 ENCOUNTER — Other Ambulatory Visit: Payer: Self-pay

## 2017-08-05 ENCOUNTER — Other Ambulatory Visit: Payer: Self-pay

## 2017-09-02 ENCOUNTER — Other Ambulatory Visit: Payer: Self-pay | Admitting: Emergency Medicine

## 2017-09-02 MED ORDER — SERTRALINE HCL 50 MG PO TABS: 50.0000 mg | ORAL_TABLET | Freq: Every day | ORAL | 0 refills | Status: DC

## 2017-09-08 ENCOUNTER — Other Ambulatory Visit: Payer: Self-pay | Admitting: Emergency Medicine

## 2017-09-08 MED ORDER — SERTRALINE HCL 50 MG PO TABS: 50.0000 mg | ORAL_TABLET | Freq: Every day | ORAL | 0 refills | Status: DC

## 2018-03-01 NOTE — H&P (Addendum)
Margaretmary BayleyHeather M Nemitz is an 32 y.o. female. [facility], desiring permanent sterilization.   Pertinent Gynecological History: Menses: flow is moderate Bleeding: monthly Contraception: condoms and OCP (estrogen/progesterone) DES exposure: denies Blood transfusions: none Sexually transmitted diseases: no past history Previous GYN Procedures: colpo  Last pap: normal Date: 2019 OB History: G3, P3   Menstrual History No LMP recorded. (Menstrual status: IUD).    Past Medical History:  Diagnosis Date  . Abnormal Pap smear 2011  . Anxiety   . Depression   . GERD (gastroesophageal reflux disease)    during pregnancy  . H/O thyroid nodule   . HPV in female   . Thyroid disease    Nodules  . Vaginal Pap smear, abnormal     Past Surgical History:  Procedure Laterality Date  . CHOLECYSTECTOMY  2011  . COLPOSCOPY      Family History  Problem Relation Age of Onset  . Heart disease Maternal Uncle   . Heart attack Maternal Uncle 30  . Emphysema Maternal Grandmother        Smoker  . Thyroid disease Maternal Grandmother   . Aneurysm Maternal Grandmother        AAA -- s/p 2 surgical procedures  . Hyperlipidemia Maternal Grandmother   . Cancer Maternal Grandmother        Skin  . Heart disease Maternal Grandfather        5 bypass  . Diabetes Maternal Grandfather   . Hypertension Maternal Grandfather   . Healthy Son   . ADD / ADHD Son   . Anemia Son   . Asthma Son   . Healthy Daughter     Social History:  reports that she quit smoking about 2 years ago. Her smoking use included cigarettes. She has a 5.00 pack-year smoking history. She has never used smokeless tobacco. She reports that she does not drink alcohol or use drugs.  Allergies:  Allergies  Allergen Reactions  . Wellbutrin [Bupropion Hcl] Hives    No medications prior to admission.    ROS  not currently breastfeeding. Physical Exam  Gen: well appearing, NAD CV: Reg rate Pulm: NWOB Abd: soft,  nondistended, nontender, no masses GYN: uterus 6 week size, no adnexa ttp/CMT Ext: No edema b/l   No results found for this or any previous visit (from the past 24 hour(s)).  No results found.  Assessment/Plan: [age] yo presenting for scheduled surgery. She has been counseled on options for contraception and opts for a [facility]SC BTL. She was counseled regarding permanence but that there is a possibility of failure. Discussed risk of ectopic pregnancy after BTL. Also reviewed risks including infxn, bleeding, damage to surrounding structures, need for additional procedures, blood transfusion, and DVT. She gave informed consent. All questions answered. Proceed with LSC BTL with filschie clips. Pelvic rest postop. 2g anceg on call to OR.   Ranae Pilalise Jennifer Xabi Wittler 03/01/2018, 1:49 PM   No changes to above H&P. If tubes noted to be abnormal, pt prefers salpingectomy to cauterization.   Rosie FateE Elgar Scoggins MD.

## 2018-03-06 NOTE — Patient Instructions (Addendum)
Your procedure is scheduled on:  Friday, April 26   [facility] at:  12:45 pm  Pick up the phone at the desk and dial [phone].  Call this number if you have problems the morning of surgery: [phone].  Remember: Do NOT eat food after midnight Thursday  Do NOT drink clear liquids (including water) after: 8 am Friday, Day of Surgery.  Take these medicines the morning of surgery with a SIP OF WATER: zyrtec if needed.  Stop herbal medications, vitamin supplements and ibuprofen 5 days prior to surgery.  Do NOT wear jewelry (body piercing), metal hair clips/bobby pins, make-up, or nail polish. Do NOT wear lotions, powders, or perfumes.  You may wear deoderant. Do NOT shave for 48 hours prior to surgery. Do NOT bring valuables to the hospital.  Have a responsible adult drive you home and stay with you for 24 hours after your procedure.  Home with Husband "T.R." cell [phone].

## 2018-03-10 ENCOUNTER — Other Ambulatory Visit: Payer: Self-pay

## 2018-03-10 ENCOUNTER — Encounter (HOSPITAL_COMMUNITY)
Admission: RE | Admit: 2018-03-10 | Discharge: 2018-03-10 | Disposition: A | Discharge status: Home / Self Care | Payer: BLUE CROSS/BLUE SHIELD | Source: Ambulatory Visit | Attending: Obstetrics and Gynecology | Admitting: Obstetrics and Gynecology

## 2018-03-10 ENCOUNTER — Encounter (HOSPITAL_COMMUNITY): Payer: Self-pay | Admitting: *Deleted

## 2018-03-10 DIAGNOSIS — Z01812 Encounter for preprocedural laboratory examination: Secondary | ICD-10-CM | POA: Diagnosis present

## 2018-03-10 HISTORY — DX: Other seasonal allergic rhinitis: J30.2

## 2018-03-10 LAB — CBC
HCT: 40.1 % (ref 36.0–46.0)
HEMOGLOBIN: 14.2 g/dL (ref 12.0–15.0)
MCH: 31.8 pg (ref 26.0–34.0)
MCHC: 35.4 g/dL (ref 30.0–36.0)
MCV: 89.7 fL (ref 78.0–100.0)
Platelets: 253 10*3/uL (ref 150–400)
RBC: 4.47 MIL/uL (ref 3.87–5.11)
RDW: 12.3 % (ref 11.5–15.5)
WBC: 8.3 10*3/uL (ref 4.0–10.5)

## 2018-03-17 ENCOUNTER — Ambulatory Visit (HOSPITAL_COMMUNITY)
Admission: AD | Admit: 2018-03-17 | Discharge: 2018-03-17 | Disposition: A | Discharge status: Home / Self Care | Payer: BLUE CROSS/BLUE SHIELD | Source: Ambulatory Visit | Attending: Obstetrics and Gynecology | Admitting: Obstetrics and Gynecology

## 2018-03-17 ENCOUNTER — Ambulatory Visit (HOSPITAL_COMMUNITY): Payer: BLUE CROSS/BLUE SHIELD | Admitting: Anesthesiology

## 2018-03-17 ENCOUNTER — Other Ambulatory Visit: Payer: Self-pay

## 2018-03-17 ENCOUNTER — Encounter (HOSPITAL_COMMUNITY)
Admission: AD | Disposition: A | Discharge status: Home / Self Care | Payer: Self-pay | Source: Ambulatory Visit | Attending: Obstetrics and Gynecology

## 2018-03-17 ENCOUNTER — Encounter (HOSPITAL_COMMUNITY): Payer: Self-pay | Admitting: Registered Nurse

## 2018-03-17 DIAGNOSIS — F419 Anxiety disorder, unspecified: Secondary | ICD-10-CM | POA: Insufficient documentation

## 2018-03-17 DIAGNOSIS — Z87891 Personal history of nicotine dependence: Secondary | ICD-10-CM | POA: Diagnosis not present

## 2018-03-17 DIAGNOSIS — Z79899 Other long term (current) drug therapy: Secondary | ICD-10-CM | POA: Diagnosis not present

## 2018-03-17 DIAGNOSIS — F329 Major depressive disorder, single episode, unspecified: Secondary | ICD-10-CM | POA: Diagnosis not present

## 2018-03-17 DIAGNOSIS — Z302 Encounter for sterilization: Secondary | ICD-10-CM | POA: Diagnosis not present

## 2018-03-17 HISTORY — PX: LAPAROSCOPIC TUBAL LIGATION: SHX1937

## 2018-03-17 LAB — PREGNANCY, URINE: Preg Test, Ur: NEGATIVE

## 2018-03-17 SURGERY — LIGATION, FALLOPIAN TUBE, LAPAROSCOPIC
Anesthesia: General | Laterality: Bilateral

## 2018-03-17 MED ORDER — ROCURONIUM BROMIDE 100 MG/10ML IV SOLN
INTRAVENOUS | Status: AC
  Filled 2018-03-17: qty 1

## 2018-03-17 MED ORDER — CEFAZOLIN SODIUM-DEXTROSE 2-4 GM/100ML-% IV SOLN
INTRAVENOUS | Status: AC
  Filled 2018-03-17: qty 100

## 2018-03-17 MED ORDER — BUPIVACAINE HCL (PF) 0.25 % IJ SOLN
INTRAMUSCULAR | Status: AC
  Filled 2018-03-17: qty 30

## 2018-03-17 MED ORDER — SUGAMMADEX SODIUM 200 MG/2ML IV SOLN
INTRAVENOUS | Status: AC
  Filled 2018-03-17: qty 4

## 2018-03-17 MED ORDER — OXYCODONE HCL 5 MG PO TABS: 5.0000 mg | ORAL_TABLET | ORAL | 0 refills | Status: DC | PRN

## 2018-03-17 MED ORDER — LIDOCAINE HCL (CARDIAC) PF 100 MG/5ML IV SOSY
PREFILLED_SYRINGE | INTRAVENOUS | Status: AC
  Filled 2018-03-17: qty 5

## 2018-03-17 MED ORDER — SCOPOLAMINE 1 MG/3DAYS TD PT72
MEDICATED_PATCH | TRANSDERMAL | Status: AC
  Filled 2018-03-17: qty 1

## 2018-03-17 MED ORDER — ROCURONIUM BROMIDE 10 MG/ML (PF) SYRINGE
PREFILLED_SYRINGE | INTRAVENOUS | Status: DC | PRN
  Administered 2018-03-17: 10 mg via INTRAVENOUS
  Administered 2018-03-17: 30 mg via INTRAVENOUS

## 2018-03-17 MED ORDER — DEXAMETHASONE SODIUM PHOSPHATE 10 MG/ML IJ SOLN
INTRAMUSCULAR | Status: DC | PRN
  Administered 2018-03-17: 10 mg via INTRAVENOUS

## 2018-03-17 MED ORDER — ONDANSETRON HCL 4 MG/2ML IJ SOLN
INTRAMUSCULAR | Status: AC
  Filled 2018-03-17: qty 2

## 2018-03-17 MED ORDER — DEXAMETHASONE SODIUM PHOSPHATE 10 MG/ML IJ SOLN
INTRAMUSCULAR | Status: AC
Start: 2018-03-17 — End: ?
  Filled 2018-03-17: qty 1

## 2018-03-17 MED ORDER — LACTATED RINGERS IV SOLN
INTRAVENOUS | Status: DC
  Administered 2018-03-17: 125 mL/h via INTRAVENOUS

## 2018-03-17 MED ORDER — MIDAZOLAM HCL 2 MG/2ML IJ SOLN
INTRAMUSCULAR | Status: AC
  Filled 2018-03-17: qty 2

## 2018-03-17 MED ORDER — FENTANYL CITRATE (PF) 250 MCG/5ML IJ SOLN
INTRAMUSCULAR | Status: AC
  Filled 2018-03-17: qty 5

## 2018-03-17 MED ORDER — PROPOFOL 10 MG/ML IV BOLUS
INTRAVENOUS | Status: AC
  Filled 2018-03-17: qty 20

## 2018-03-17 MED ORDER — PROPOFOL 10 MG/ML IV BOLUS
INTRAVENOUS | Status: DC | PRN
  Administered 2018-03-17: 170 mg via INTRAVENOUS

## 2018-03-17 MED ORDER — MIDAZOLAM HCL 5 MG/5ML IJ SOLN
INTRAMUSCULAR | Status: DC | PRN
  Administered 2018-03-17: 2 mg via INTRAVENOUS

## 2018-03-17 MED ORDER — CEFAZOLIN SODIUM-DEXTROSE 2-4 GM/100ML-% IV SOLN
2.0000 g | INTRAVENOUS | Status: AC
  Administered 2018-03-17: 2 g via INTRAVENOUS

## 2018-03-17 MED ORDER — BUPIVACAINE HCL (PF) 0.25 % IJ SOLN
INTRAMUSCULAR | Status: DC | PRN
  Administered 2018-03-17: 4 mL

## 2018-03-17 MED ORDER — FENTANYL CITRATE (PF) 100 MCG/2ML IJ SOLN
25.0000 ug | INTRAMUSCULAR | Status: DC | PRN
  Administered 2018-03-17: 50 ug via INTRAVENOUS

## 2018-03-17 MED ORDER — FENTANYL CITRATE (PF) 250 MCG/5ML IJ SOLN
INTRAMUSCULAR | Status: DC | PRN
  Administered 2018-03-17 (×2): 50 ug via INTRAVENOUS
  Administered 2018-03-17: 100 ug via INTRAVENOUS
  Administered 2018-03-17: 50 ug via INTRAVENOUS

## 2018-03-17 MED ORDER — KETOROLAC TROMETHAMINE 30 MG/ML IJ SOLN
INTRAMUSCULAR | Status: AC
  Filled 2018-03-17: qty 1

## 2018-03-17 MED ORDER — SUGAMMADEX SODIUM 200 MG/2ML IV SOLN
INTRAVENOUS | Status: DC | PRN
Start: 2018-03-17 — End: 2018-03-17
  Administered 2018-03-17: 200 mg via INTRAVENOUS

## 2018-03-17 MED ORDER — ACETAMINOPHEN 160 MG/5ML PO SOLN
975.0000 mg | Freq: Once | ORAL | Status: AC
  Administered 2018-03-17: 975 mg via ORAL

## 2018-03-17 MED ORDER — ACETAMINOPHEN 160 MG/5ML PO SOLN
ORAL | Status: AC
  Administered 2018-03-17: 975 mg via ORAL
  Filled 2018-03-17: qty 40.6

## 2018-03-17 MED ORDER — KETOROLAC TROMETHAMINE 30 MG/ML IJ SOLN
INTRAMUSCULAR | Status: DC | PRN
  Administered 2018-03-17: 30 mg via INTRAVENOUS

## 2018-03-17 MED ORDER — ONDANSETRON HCL 4 MG/2ML IJ SOLN
INTRAMUSCULAR | Status: DC | PRN
  Administered 2018-03-17: 4 mg via INTRAVENOUS

## 2018-03-17 MED ORDER — FENTANYL CITRATE (PF) 100 MCG/2ML IJ SOLN
INTRAMUSCULAR | Status: AC
  Administered 2018-03-17: 50 ug via INTRAVENOUS
  Filled 2018-03-17: qty 2

## 2018-03-17 MED ORDER — SCOPOLAMINE 1 MG/3DAYS TD PT72
1.0000 | MEDICATED_PATCH | Freq: Once | TRANSDERMAL | Status: DC
  Administered 2018-03-17: 1.5 mg via TRANSDERMAL

## 2018-03-17 MED ORDER — LIDOCAINE 2% (20 MG/ML) 5 ML SYRINGE
INTRAMUSCULAR | Status: DC | PRN
  Administered 2018-03-17: 80 mg via INTRAVENOUS

## 2018-03-17 SURGICAL SUPPLY — 31 items
CATH ROBINSON RED A/P 16FR (CATHETERS) ×3 IMPLANT
CLIP FILSHIE TUBAL LIGA STRL (Clip) ×6 IMPLANT
DERMABOND ADHESIVE PROPEN (GAUZE/BANDAGES/DRESSINGS) ×2
DERMABOND ADVANCED (GAUZE/BANDAGES/DRESSINGS) ×2
DERMABOND ADVANCED .7 DNX12 (GAUZE/BANDAGES/DRESSINGS) ×1 IMPLANT
DERMABOND ADVANCED .7 DNX6 (GAUZE/BANDAGES/DRESSINGS) ×1 IMPLANT
DRSG OPSITE 4X5.5 SM (GAUZE/BANDAGES/DRESSINGS) ×3 IMPLANT
DRSG OPSITE POSTOP 3X4 (GAUZE/BANDAGES/DRESSINGS) IMPLANT
DURAPREP 26ML APPLICATOR (WOUND CARE) ×3 IMPLANT
GLOVE BIO SURGEON STRL SZ 6.5 (GLOVE) ×2 IMPLANT
GLOVE BIO SURGEONS STRL SZ 6.5 (GLOVE) ×1
GLOVE BIOGEL PI IND STRL 6.5 (GLOVE) ×2 IMPLANT
GLOVE BIOGEL PI IND STRL 7.0 (GLOVE) ×2 IMPLANT
GLOVE BIOGEL PI INDICATOR 6.5 (GLOVE) ×4
GLOVE BIOGEL PI INDICATOR 7.0 (GLOVE) ×4
GOWN STRL REUS W/TWL LRG LVL3 (GOWN DISPOSABLE) ×6 IMPLANT
NEEDLE INSUFFLATION 120MM (ENDOMECHANICALS) ×3 IMPLANT
PACK LAPAROSCOPY BASIN (CUSTOM PROCEDURE TRAY) ×3 IMPLANT
PACK TRENDGUARD 450 HYBRID PRO (MISCELLANEOUS) IMPLANT
PACK TRENDGUARD 600 HYBRD PROC (MISCELLANEOUS) IMPLANT
PROTECTOR NERVE ULNAR (MISCELLANEOUS) ×6 IMPLANT
SLEEVE XCEL OPT CAN 5 100 (ENDOMECHANICALS) IMPLANT
SUT VICRYL 0 UR6 27IN ABS (SUTURE) ×3 IMPLANT
SUT VICRYL RAPIDE 4/0 PS 2 (SUTURE) ×3 IMPLANT
TOWEL OR 17X24 6PK STRL BLUE (TOWEL DISPOSABLE) ×6 IMPLANT
TRENDGUARD 450 HYBRID PRO PACK (MISCELLANEOUS)
TRENDGUARD 600 HYBRID PROC PK (MISCELLANEOUS)
TROCAR 12M 150ML BLUNT (TROCAR) ×3 IMPLANT
TROCAR XCEL NON-BLD 11X100MML (ENDOMECHANICALS) ×3 IMPLANT
TROCAR XCEL NON-BLD 5MMX100MML (ENDOMECHANICALS) ×3 IMPLANT
WARMER LAPAROSCOPE (MISCELLANEOUS) ×3 IMPLANT

## 2018-03-17 NOTE — Transfer of Care (Signed)
Immediate Anesthesia Transfer of Care Note  Patient: Brandi Burns  Procedure(s) Performed: LAPAROSCOPIC TUBAL LIGATION WITH FILSHIE CLIPS (Bilateral )  Patient Location: PACU  Anesthesia Type:General  Level of Consciousness: sedated  Airway & Oxygen Therapy: Patient Spontanous Breathing and Patient connected to nasal cannula oxygen  Post-op Assessment: Report given to RN and Post -op Vital signs reviewed and stable  Post vital signs: Reviewed and stable  Last Vitals:  Vitals Value Taken Time  BP 127/60 03/17/2018  3:09 PM  Temp    Pulse 91 03/17/2018  3:10 PM  Resp 16 03/17/2018  3:10 PM  SpO2 100 % 03/17/2018  3:10 PM  Vitals shown include unvalidated device data.  Last Pain:  Vitals:   03/17/18 1247  TempSrc: Oral      Patients Stated Pain Goal: 3 (03/17/18 1253)  Complications: No apparent anesthesia complications

## 2018-03-17 NOTE — Discharge Instructions (Signed)

## 2018-03-17 NOTE — Anesthesia Procedure Notes (Signed)
Procedure Name: Intubation Date/Time: 03/17/2018 1:45 PM Performed by: Jhonnie GarnerMarshall, Chayanne Filippi M, CRNA Pre-anesthesia Checklist: Patient identified, Emergency Drugs available, Suction available and Patient being monitored Patient Re-evaluated:Patient Re-evaluated prior to induction Oxygen Delivery Method: Circle system utilized Preoxygenation: Pre-oxygenation with 100% oxygen Induction Type: IV induction Ventilation: Mask ventilation without difficulty Laryngoscope Size: Miller and 2 Grade View: Grade II Tube type: Oral Tube size: 7.0 mm Number of attempts: 1 Airway Equipment and Method: Stylet Placement Confirmation: ETT inserted through vocal cords under direct vision,  positive ETCO2 and breath sounds checked- equal and bilateral Secured at: 21 cm Tube secured with: Tape Dental Injury: Teeth and Oropharynx as per pre-operative assessment

## 2018-03-17 NOTE — Anesthesia Postprocedure Evaluation (Signed)
Anesthesia Post Note  Patient: Brandi BasemanHeather M Burns  Procedure(s) Performed: LAPAROSCOPIC TUBAL LIGATION WITH FILSHIE CLIPS (Bilateral )     Patient location during evaluation: PACU Anesthesia Type: General Level of consciousness: awake and alert Pain management: pain level controlled Vital Signs Assessment: post-procedure vital signs reviewed and stable Respiratory status: spontaneous breathing, nonlabored ventilation, respiratory function stable and patient connected to nasal cannula oxygen Cardiovascular status: blood pressure returned to baseline and stable Postop Assessment: no apparent nausea or vomiting Anesthetic complications: no    Last Vitals:  Vitals:   03/17/18 1545 03/17/18 1553  BP: 116/62 116/64  Pulse: 84 79  Resp: 11 10  Temp:    SpO2: 97% 95%    Last Pain:  Vitals:   03/17/18 1545  TempSrc:   PainSc: 2    Pain Goal: Patients Stated Pain Goal: 3 (03/17/18 1253)               Cristela BlueJACKSON,Dantre Yearwood EDWARD

## 2018-03-17 NOTE — Op Note (Signed)
  PREOPERATIVE DIAGNOSES: 1. Desires permanent sterilization  POSTOPERATIVE DIAGNOSES: 1. Same  PROCEDURE PERFORMED: Bilateral tubal ligation via Filshie clips  SURGEON: Dr. Belva AgeeElise Katey Barrie ASSISTANT: None  ANESTHESIA: General   ESTIMATED BLOOD LOSS: 10cc.  URINE OUTPUT: 40 cc of clear urine  COMPLICATIONS: None  TUBES: None.  DRAINS: None  PATHOLOGY: N/A  FINDINGS: On exam, under anesthesia, normal appearing vulva and vagina, a normal sized uterus   Operative findings demonstrated a  normal uterus. Normal fallopian tubes and ovaries bilaterally.  The bowel and omentum were normal appearing.  Procedure: A general anesthesia was induced and the patient was placed in the dirsal lithotomy position. The abdomen, perineum, and vagina were prepped and draped in the usual fashion. The bladder was drained. After the initial preparation, the procedure commenced at the vagina. With a speculum in place to visualize the cervix, the cervix was grasped and a jacobs tenaculum was placed within the uterine cavity for manipulation purposes being careful not to puncture the uterus. Attention was then turned to the abdomen.    An infraumbilical incision was made and the Veress needle was gently advanced taking care to feel for the typical sensation of penetrating the peritoneum. With CO2 infiltration, an opening pressure of 4mmHg was noted, and following this, a pneumoperitoneum of 15 mmHg was created. A 11 mm trocar was then passed through the same incision and the laparoscope was then inserted through the trocar sleeve.  Visualization of the peritoneal cavity was then obtained and a brief inspection did not reveal any signs of complications from entry. Under direct observation, 5mm suprapubic port was placed taking care to respect anatomical landmarks and vessels. Once the placement of the port was complete, the actual laparoscopic procedure began. Above operative findings noted.  A grasper was used to  isolate the right fallopian tube following it down to its fimbriated end. Once confirmation of fallopian tube done, filschie clip introduced via the operative scope and placed across the entire length of the fallopian tube with good blanching around it. Tube thoroughly inspected and proper placement noted. The same thing was done on the left fallopian tube.  Hemostasis was noted and a complete inspection confirmed proper placement bilaterally.   All gas removed from abdomen and all instruments were removed. Fascia was closed with 0' vicryl and skin closed with 4'0 vicryl. The sponge and lap counts were correct times 2 at this time.  Jacobs tenaculum was removed.   The patient's procedure was terminated. We then awakened her. She was sent to the Recovery Room in good condition.

## 2018-03-17 NOTE — Anesthesia Preprocedure Evaluation (Signed)
Anesthesia Evaluation  Patient identified by MRN, date of birth, ID band Patient awake    Reviewed: Allergy & Precautions, H&P , Patient's Chart, lab work & pertinent test results, reviewed documented beta blocker date and time   Airway Mallampati: II  TM Distance: >3 FB Neck ROM: full    Dental no notable dental hx.    Pulmonary former smoker,    Pulmonary exam normal breath sounds clear to auscultation       Cardiovascular  Rhythm:regular Rate:Normal     Neuro/Psych    GI/Hepatic   Endo/Other    Renal/GU      Musculoskeletal   Abdominal   Peds  Hematology   Anesthesia Other Findings   Reproductive/Obstetrics                             Anesthesia Physical Anesthesia Plan  ASA: II  Anesthesia Plan: General   Post-op Pain Management:    Induction: Intravenous  PONV Risk Score and Plan:   Airway Management Planned: Oral ETT  Additional Equipment:   Intra-op Plan:   Post-operative Plan: Extubation in OR  Informed Consent: I have reviewed the patients History and Physical, chart, labs and discussed the procedure including the risks, benefits and alternatives for the proposed anesthesia with the patient or authorized representative who has indicated his/her understanding and acceptance.   Dental Advisory Given  Plan Discussed with: CRNA and Surgeon  Anesthesia Plan Comments: (  )        Anesthesia Quick Evaluation  

## 2018-03-17 NOTE — Brief Op Note (Signed)
03/17/2018  3:05 PM  PATIENT:  Brandi BasemanHeather M Burns  32 y.o. female  PRE-OPERATIVE DIAGNOSIS:  DESIRES STERILITY  POST-OPERATIVE DIAGNOSIS:  DESIRES STERILITY  PROCEDURE:  Procedure(s): LAPAROSCOPIC TUBAL LIGATION WITH FILSHIE CLIPS (Bilateral)  SURGEON:  Surgeon(s) and Role:    * Annaleigh Steinmeyer, Madelaine EtienneElise Jennifer, MD - Primary  PHYSICIAN ASSISTANT:   ASSISTANTS: none   ANESTHESIA:   general  EBL:  10 mL   BLOOD ADMINISTERED:none  DRAINS: none   LOCAL MEDICATIONS USED:  MARCAINE    and Amount: 4 ml  SPECIMEN:  No Specimen  DISPOSITION OF SPECIMEN:  N/A  COUNTS:  YES  TOURNIQUET:  * No tourniquets in log *  DICTATION: .Note written in EPIC  PLAN OF CARE: Discharge to home after PACU  PATIENT DISPOSITION:  PACU - hemodynamically stable.   Delay start of Pharmacological VTE agent (>24hrs) due to surgical blood loss or risk of bleeding: not applicable

## 2018-03-24 ENCOUNTER — Encounter (HOSPITAL_COMMUNITY): Payer: Self-pay | Admitting: Obstetrics and Gynecology

## 2018-05-08 ENCOUNTER — Other Ambulatory Visit: Payer: Self-pay

## 2018-05-08 ENCOUNTER — Encounter (HOSPITAL_COMMUNITY): Payer: Self-pay | Admitting: *Deleted

## 2018-05-08 NOTE — H&P (Signed)
Brandi Burns is an Brand[age] Burns y.o. female with right sided ectopic pregnancy.  The patient underwent uncomplicated L/S BTL 03/17/18.  She had pregnancy symptoms and had positive upt last Thursday.  Based on LMP, patient should have been 7 weeks. Quant on Friday was 757 and ultrasound was non-diagnostic.  Given suspicion for ectopic pregnancy, the patient was offered MTX but declined as normal pregnancy could not be excluded. On Monday, quant increased to 1797 but ultrasound showed suspected right ectopic pregnancy, no free fluid.  Given the patient's recent BTL, she elected to proceed with L/S bilateral salpingectomy to eliminate risk of recurrence.  She has had no pain or vaginal bleeding at any point.  Blood type O pos.   Pertinent Gynecological History: Menses: n/a Bleeding: n/a Contraception: tubal ligation DES exposure: unknown Blood transfusions: none Sexually transmitted diseases: past history: HPV Previous GYN Procedures: L/S BTL  Last mammogram: n/a Date: n/a Last pap: normal Date: 2019 OB History: G4, P3013   Menstrual History: Menarche age: n/a No LMP recorded.    Past Medical History:  Diagnosis Date  . Abnormal Pap smear 2011  . Anxiety   . Depression   . H/O thyroid nodule    MD just watching, no treatment/no meds  . HPV in female   . Seasonal allergies   . SVD (spontaneous vaginal delivery)    x 3  . Thyroid disease    Nodules- MD just watching, no treatments/no meds  . Thyroid nodule   . Vaginal Pap smear, abnormal     Past Surgical History:  Procedure Laterality Date  . CHOLECYSTECTOMY  2011  . COLPOSCOPY    . LAPAROSCOPIC TUBAL LIGATION Bilateral 03/17/2018   Procedure: LAPAROSCOPIC TUBAL LIGATION WITH FILSHIE CLIPS;  Surgeon: Ranae PilaLeger, Elise Jennifer, MD;  Location: [facility];  Service: Gynecology;  Laterality: Bilateral;  . TUBAL LIGATION      Family History  Problem Relation Age of Onset  . Heart disease Maternal Uncle   . Heart attack Maternal Uncle 30  .  Emphysema Maternal Grandmother        Smoker  . Thyroid disease Maternal Grandmother   . Aneurysm Maternal Grandmother        AAA -- s/p 2 surgical procedures  . Hyperlipidemia Maternal Grandmother   . Cancer Maternal Grandmother        Skin  . Heart disease Maternal Grandfather        5 bypass  . Diabetes Maternal Grandfather   . Hypertension Maternal Grandfather   . Healthy Son   . ADD / ADHD Son   . Anemia Son   . Asthma Son   . Healthy Daughter     Social History:  reports that she quit smoking about 2 years ago. Her smoking use included cigarettes. She has a 5.00 pack-year smoking history. She has never used smokeless tobacco. She reports that she drinks alcohol. She reports that she does not use drugs.  Allergies:  Allergies  Allergen Reactions  . Wellbutrin [Bupropion Hcl] Hives    No medications prior to admission.    ROS  Height 5\' 5"  (1.651 m), weight 170 lb (77.1 kg), not currently breastfeeding. Physical Exam  Constitutional: She is oriented to person, place, and time. She appears well-developed and well-nourished.  GI: Soft. There is no rebound.  Neurological: She is alert and oriented to person, place, and time.  Skin: Skin is warm and dry.  Psychiatric: She has a normal mood and affect. Her behavior is normal.  No results found for this or any previous visit (from the past 24 hour(s)).  No results found.  Assessment/Plan: 41 [id number] with suspected right ectopic pregnancy, s/p BTL -L/S removal of ectopic pregnancy, bilateral salpingectomy -Patient is counseled re: risk of bleeding, infection, scarring and damage to surrounding structures.  All questions were answered.    Brandi Burns 05/08/2018, 8:31 PM

## 2018-05-09 ENCOUNTER — Encounter (HOSPITAL_COMMUNITY)
Admission: RE | Disposition: A | Discharge status: Home / Self Care | Payer: Self-pay | Source: Ambulatory Visit | Attending: Obstetrics & Gynecology

## 2018-05-09 ENCOUNTER — Ambulatory Visit (HOSPITAL_COMMUNITY)
Admission: RE | Admit: 2018-05-09 | Discharge: 2018-05-09 | Disposition: A | Discharge status: Home / Self Care | Payer: BLUE CROSS/BLUE SHIELD | Source: Ambulatory Visit | Attending: Obstetrics & Gynecology | Admitting: Obstetrics & Gynecology

## 2018-05-09 ENCOUNTER — Other Ambulatory Visit: Payer: Self-pay

## 2018-05-09 ENCOUNTER — Encounter (HOSPITAL_COMMUNITY): Payer: Self-pay

## 2018-05-09 ENCOUNTER — Ambulatory Visit (HOSPITAL_COMMUNITY): Payer: BLUE CROSS/BLUE SHIELD | Admitting: Certified Registered Nurse Anesthetist

## 2018-05-09 DIAGNOSIS — Z888 Allergy status to other drugs, medicaments and biological substances status: Secondary | ICD-10-CM | POA: Diagnosis not present

## 2018-05-09 DIAGNOSIS — O00101 Right tubal pregnancy without intrauterine pregnancy: Secondary | ICD-10-CM | POA: Diagnosis not present

## 2018-05-09 DIAGNOSIS — Z8249 Family history of ischemic heart disease and other diseases of the circulatory system: Secondary | ICD-10-CM | POA: Insufficient documentation

## 2018-05-09 DIAGNOSIS — Z87891 Personal history of nicotine dependence: Secondary | ICD-10-CM | POA: Diagnosis not present

## 2018-05-09 DIAGNOSIS — F329 Major depressive disorder, single episode, unspecified: Secondary | ICD-10-CM | POA: Diagnosis not present

## 2018-05-09 DIAGNOSIS — F419 Anxiety disorder, unspecified: Secondary | ICD-10-CM | POA: Insufficient documentation

## 2018-05-09 HISTORY — PX: DIAGNOSTIC LAPAROSCOPY WITH REMOVAL OF ECTOPIC PREGNANCY: SHX6449

## 2018-05-09 HISTORY — DX: Nontoxic single thyroid nodule: E04.1

## 2018-05-09 HISTORY — PX: LAPAROSCOPIC BILATERAL SALPINGECTOMY: SHX5889

## 2018-05-09 LAB — TYPE AND SCREEN
ABO/RH(D): O POS
ANTIBODY SCREEN: NEGATIVE

## 2018-05-09 LAB — CBC
HEMATOCRIT: 42.6 % (ref 36.0–46.0)
HEMOGLOBIN: 14.8 g/dL (ref 12.0–15.0)
MCH: 31.6 pg (ref 26.0–34.0)
MCHC: 34.7 g/dL (ref 30.0–36.0)
MCV: 91 fL (ref 78.0–100.0)
Platelets: 266 10*3/uL (ref 150–400)
RBC: 4.68 MIL/uL (ref 3.87–5.11)
RDW: 12.9 % (ref 11.5–15.5)
WBC: 8.8 10*3/uL (ref 4.0–10.5)

## 2018-05-09 SURGERY — LAPAROSCOPY, WITH ECTOPIC PREGNANCY SURGICAL TREATMENT
Anesthesia: General | Site: Abdomen | Laterality: Bilateral

## 2018-05-09 MED ORDER — LACTATED RINGERS IV SOLN
INTRAVENOUS | Status: DC
  Administered 2018-05-09 (×2): via INTRAVENOUS

## 2018-05-09 MED ORDER — FENTANYL CITRATE (PF) 100 MCG/2ML IJ SOLN
INTRAMUSCULAR | Status: DC | PRN
  Administered 2018-05-09 (×3): 50 ug via INTRAVENOUS
  Administered 2018-05-09: 100 ug via INTRAVENOUS

## 2018-05-09 MED ORDER — ONDANSETRON HCL 4 MG/2ML IJ SOLN
INTRAMUSCULAR | Status: DC | PRN
  Administered 2018-05-09 (×2): 2 mg via INTRAVENOUS

## 2018-05-09 MED ORDER — ONDANSETRON HCL 4 MG/2ML IJ SOLN
INTRAMUSCULAR | Status: AC
  Filled 2018-05-09: qty 2

## 2018-05-09 MED ORDER — PROPOFOL 10 MG/ML IV BOLUS
INTRAVENOUS | Status: AC
  Filled 2018-05-09: qty 20

## 2018-05-09 MED ORDER — LIDOCAINE HCL (CARDIAC) PF 100 MG/5ML IV SOSY
PREFILLED_SYRINGE | INTRAVENOUS | Status: AC
  Filled 2018-05-09: qty 5

## 2018-05-09 MED ORDER — OXYCODONE-ACETAMINOPHEN 5-325 MG PO TABS: 1.0000 | ORAL_TABLET | ORAL | 0 refills | Status: DC | PRN

## 2018-05-09 MED ORDER — SUGAMMADEX SODIUM 200 MG/2ML IV SOLN
INTRAVENOUS | Status: AC
  Filled 2018-05-09: qty 2

## 2018-05-09 MED ORDER — KETOROLAC TROMETHAMINE 30 MG/ML IJ SOLN
INTRAMUSCULAR | Status: DC | PRN
  Administered 2018-05-09: 30 mg via INTRAVENOUS

## 2018-05-09 MED ORDER — GLYCOPYRROLATE 0.2 MG/ML IJ SOLN
INTRAMUSCULAR | Status: DC | PRN
  Administered 2018-05-09 (×2): 0.1 mg via INTRAVENOUS

## 2018-05-09 MED ORDER — FENTANYL CITRATE (PF) 100 MCG/2ML IJ SOLN: 25.0000 ug | INTRAMUSCULAR | Status: DC | PRN

## 2018-05-09 MED ORDER — PROPOFOL 10 MG/ML IV BOLUS
INTRAVENOUS | Status: DC | PRN
  Administered 2018-05-09: 200 mg via INTRAVENOUS

## 2018-05-09 MED ORDER — SODIUM CHLORIDE 0.9 % IJ SOLN
INTRAMUSCULAR | Status: AC
  Filled 2018-05-09: qty 10

## 2018-05-09 MED ORDER — LACTATED RINGERS IV SOLN: INTRAVENOUS | Status: DC

## 2018-05-09 MED ORDER — SODIUM CHLORIDE 0.9 % IR SOLN
Status: DC | PRN
  Administered 2018-05-09: 3000 mL

## 2018-05-09 MED ORDER — MEPERIDINE HCL 25 MG/ML IJ SOLN: 6.2500 mg | INTRAMUSCULAR | Status: DC | PRN

## 2018-05-09 MED ORDER — MIDAZOLAM HCL 2 MG/2ML IJ SOLN
INTRAMUSCULAR | Status: DC | PRN
  Administered 2018-05-09: 0.5 mg via INTRAVENOUS
  Administered 2018-05-09: 1.5 mg via INTRAVENOUS

## 2018-05-09 MED ORDER — SCOPOLAMINE 1 MG/3DAYS TD PT72
MEDICATED_PATCH | TRANSDERMAL | Status: AC
  Administered 2018-05-09: 1.5 mg via TRANSDERMAL
  Filled 2018-05-09: qty 1

## 2018-05-09 MED ORDER — SCOPOLAMINE 1 MG/3DAYS TD PT72
1.0000 | MEDICATED_PATCH | Freq: Once | TRANSDERMAL | Status: DC
  Administered 2018-05-09: 1.5 mg via TRANSDERMAL

## 2018-05-09 MED ORDER — MIDAZOLAM HCL 2 MG/2ML IJ SOLN
INTRAMUSCULAR | Status: AC
  Filled 2018-05-09: qty 2

## 2018-05-09 MED ORDER — GLYCOPYRROLATE 0.2 MG/ML IJ SOLN
INTRAMUSCULAR | Status: AC
  Filled 2018-05-09: qty 1

## 2018-05-09 MED ORDER — SUGAMMADEX SODIUM 200 MG/2ML IV SOLN
INTRAVENOUS | Status: DC | PRN
  Administered 2018-05-09: 200 mg via INTRAVENOUS

## 2018-05-09 MED ORDER — DEXAMETHASONE SODIUM PHOSPHATE 10 MG/ML IJ SOLN
INTRAMUSCULAR | Status: AC
Start: 2018-05-09 — End: ?
  Filled 2018-05-09: qty 1

## 2018-05-09 MED ORDER — IBUPROFEN 800 MG PO TABS: 800.0000 mg | ORAL_TABLET | Freq: Four times a day (QID) | ORAL | 0 refills | Status: DC | PRN

## 2018-05-09 MED ORDER — PHENYLEPHRINE HCL 10 MG/ML IJ SOLN
INTRAMUSCULAR | Status: DC | PRN
  Administered 2018-05-09: 80 mg via INTRAVENOUS

## 2018-05-09 MED ORDER — METOCLOPRAMIDE HCL 5 MG/ML IJ SOLN: 10.0000 mg | Freq: Once | INTRAMUSCULAR | Status: DC | PRN

## 2018-05-09 MED ORDER — LIDOCAINE HCL (CARDIAC) PF 100 MG/5ML IV SOSY
PREFILLED_SYRINGE | INTRAVENOUS | Status: DC | PRN
  Administered 2018-05-09: 80 mg via INTRAVENOUS

## 2018-05-09 MED ORDER — KETOROLAC TROMETHAMINE 30 MG/ML IJ SOLN
INTRAMUSCULAR | Status: AC
  Filled 2018-05-09: qty 1

## 2018-05-09 MED ORDER — ROCURONIUM BROMIDE 100 MG/10ML IV SOLN
INTRAVENOUS | Status: AC
  Filled 2018-05-09: qty 1

## 2018-05-09 MED ORDER — ROCURONIUM BROMIDE 100 MG/10ML IV SOLN
INTRAVENOUS | Status: DC | PRN
  Administered 2018-05-09: 30 mg via INTRAVENOUS
  Administered 2018-05-09: 5 mg via INTRAVENOUS

## 2018-05-09 MED ORDER — FENTANYL CITRATE (PF) 250 MCG/5ML IJ SOLN
INTRAMUSCULAR | Status: AC
  Filled 2018-05-09: qty 5

## 2018-05-09 MED ORDER — BUPIVACAINE HCL (PF) 0.25 % IJ SOLN
INTRAMUSCULAR | Status: AC
  Filled 2018-05-09: qty 30

## 2018-05-09 MED ORDER — DEXAMETHASONE SODIUM PHOSPHATE 10 MG/ML IJ SOLN
INTRAMUSCULAR | Status: DC | PRN
  Administered 2018-05-09: 10 mg via INTRAVENOUS

## 2018-05-09 MED ORDER — LACTATED RINGERS IV SOLN
INTRAVENOUS | Status: DC
  Administered 2018-05-09: 125 mL/h via INTRAVENOUS

## 2018-05-09 SURGICAL SUPPLY — 30 items
BARRIER ADHS 3X4 INTERCEED (GAUZE/BANDAGES/DRESSINGS) IMPLANT
BENZOIN TINCTURE PRP APPL 2/3 (GAUZE/BANDAGES/DRESSINGS) ×3 IMPLANT
CABLE HIGH FREQUENCY MONO STRZ (ELECTRODE) IMPLANT
CATH ROBINSON RED A/P 16FR (CATHETERS) ×3 IMPLANT
CLOSURE WOUND 1/2 X4 (GAUZE/BANDAGES/DRESSINGS) ×1
DURAPREP 26ML APPLICATOR (WOUND CARE) ×3 IMPLANT
FORCEPS CUTTING 45CM 5MM (CUTTING FORCEPS) ×3 IMPLANT
GLOVE BIO SURGEON STRL SZ 6 (GLOVE) ×3 IMPLANT
GLOVE BIOGEL PI IND STRL 6 (GLOVE) ×2 IMPLANT
GLOVE BIOGEL PI IND STRL 7.0 (GLOVE) ×1 IMPLANT
GLOVE BIOGEL PI INDICATOR 6 (GLOVE) ×4
GLOVE BIOGEL PI INDICATOR 7.0 (GLOVE) ×2
GOWN STRL REUS W/TWL LRG LVL3 (GOWN DISPOSABLE) ×9 IMPLANT
NEEDLE INSUFFLATION 120MM (ENDOMECHANICALS) ×3 IMPLANT
NS IRRIG 1000ML POUR BTL (IV SOLUTION) ×3 IMPLANT
PACK LAPAROSCOPY BASIN (CUSTOM PROCEDURE TRAY) ×3 IMPLANT
PACK TRENDGUARD 450 HYBRID PRO (MISCELLANEOUS) ×1 IMPLANT
POUCH SPECIMEN RETRIEVAL 10MM (ENDOMECHANICALS) ×3 IMPLANT
PROTECTOR NERVE ULNAR (MISCELLANEOUS) ×6 IMPLANT
SEALER TISSUE G2 CVD JAW 45CM (ENDOMECHANICALS) IMPLANT
SET IRRIG TUBING LAPAROSCOPIC (IRRIGATION / IRRIGATOR) ×3 IMPLANT
SLEEVE XCEL OPT CAN 5 100 (ENDOMECHANICALS) ×3 IMPLANT
STRIP CLOSURE SKIN 1/2X4 (GAUZE/BANDAGES/DRESSINGS) ×2 IMPLANT
SUT MNCRL AB 3-0 PS2 27 (SUTURE) ×3 IMPLANT
SUT VICRYL 0 UR6 27IN ABS (SUTURE) ×3 IMPLANT
TOWEL OR 17X24 6PK STRL BLUE (TOWEL DISPOSABLE) ×6 IMPLANT
TRENDGUARD 450 HYBRID PRO PACK (MISCELLANEOUS) ×3
TROCAR XCEL NON-BLD 11X100MML (ENDOMECHANICALS) ×3 IMPLANT
TROCAR XCEL NON-BLD 5MMX100MML (ENDOMECHANICALS) ×3 IMPLANT
WARMER LAPAROSCOPE (MISCELLANEOUS) ×3 IMPLANT

## 2018-05-09 NOTE — Op Note (Signed)
Brandi BasemanHeather M Burns 05/09/2018  PREOPERATIVE DIAGNOSIS:  Suspected right ectopic pregnancy s/p bilateral tubal ligation  POSTOPERATIVE DIAGNOSIS:  SAA  PROCEDURE:  Laparoscopic removal of right ectopic pregnancy, bilateral salpingectomy  ANESTHESIA:  General endotracheal  COMPLICATIONS:  None immediate.  PATHOLOGY: Bilateral fallopian tubes; right fallopian tube tagged with suture  ESTIMATED BLOOD LOSS:  Less than 20 ml.  INDICATIONS: 32 y.o.  with suspected right ectopic pregnancy.  Patient is s/p bilateral tubal ligation and desires surgical treatment.      FINDINGS:  Normal uterus and ovaries, normal left fallopian tube, mass in right fallopian tube immediately distal to Filshie clip consistent with ectopic pregnancy  TECHNIQUE:  The patient was taken to the operating room where general anesthesia was obtained without difficulty.  She was then placed in the dorsal lithotomy position and prepared and draped in sterile fashion.  After an adequate timeout was performed, a sponge on a stick was placed in the patient's vagina.  Attention was then turned to the patient's abdomen where a 10-mm skin incision was made on the umbilical fold.  The Veress needle was carefully introduced into the peritoneal cavity through the abdominal wall.  Intraperitoneal placement was confirmed by drop in intraabdominal pressure with insufflation of carbon dioxide gas.  Adequate pneumoperitoneum was obtained, and the 10/11 XL trocar was then advanced without difficulty into the abdomen where intraabdominal placement was confirmed by the operative laparoscope.  Trace blood was noted in the pelvis.  5-mm skin incision was made in the suprapubic region and under direct visualization with the laparoscope, the 5 mm trocar was advanced atraumatically.  Both Filshie clips were clearly visualized and in appropriate position.  Immediately distal to the right Filshie clip, a fullness was seen in the fallopian tube; non-ruptured.   Using the Gyrus, the right fallopian tube was excised past the Filshie clip.  The left fallopian tube was removed in the same fashion.  Hemostasis was observed.  Using and Endocatch bag, both fallopian tubes were removed.  Irrigation was performed and continued hemostasis observed.  Instruments were removed from the abdomen.  Infraumbilical fascia was closed with a single 0 vicryl figure of eight stitch.  Skin incisions were closed with 3-0 vicryl in a subcuticular fashion.    The uterine manipulator was removed from the vagina without complications. The patient tolerated the procedure well.  Sponge, lap, and needle counts were correct times two.  The patient was then taken to the recovery room awake, extubated and in stable  in stable condition.

## 2018-05-09 NOTE — Transfer of Care (Signed)
Immediate Anesthesia Transfer of Care Note  Patient: Brandi Burns  Procedure(s) Performed: DIAGNOSTIC LAPAROSCOPY WITH REMOVAL OF ECTOPIC PREGNANCY (Bilateral Abdomen) LAPAROSCOPIC BILATERAL SALPINGECTOMY (Bilateral Abdomen)  Patient Location: PACU  Anesthesia Type:General  Level of Consciousness: awake, alert  and oriented  Airway & Oxygen Therapy: Patient Spontanous Breathing and Patient connected to nasal cannula oxygen  Post-op Assessment: Report given to RN, Post -op Vital signs reviewed and stable and Patient moving all extremities X 4  Post vital signs: Reviewed and stable  Last Vitals:  Vitals Value Taken Time  BP 120/62 05/09/2018  2:12 PM  Temp 37.2 C 05/09/2018  2:12 PM  Pulse 93 05/09/2018  2:12 PM  Resp 16 05/09/2018  2:12 PM  SpO2 100 % 05/09/2018  2:12 PM    Last Pain:  Vitals:   05/09/18 1136  TempSrc: Oral  PainSc: 0-No pain      Patients Stated Pain Goal: 5 (05/09/18 1136)  Complications: No apparent anesthesia complications

## 2018-05-09 NOTE — Discharge Instructions (Addendum)
Call MD for T>100.4, heavy vaginal bleeding, severe abdominal pain, or respiratory distress.  Follow up in office in 2 weeks.  Pelvic rest x 2 weeks.  No driving while taking narcotics.  DISCHARGE INSTRUCTIONS: Laparoscopy  The following instructions have been prepared to help you care for yourself upon your return home today.  Wound care:  Do not get the incision wet for the first 24 hours. The incision should be kept clean and dry.  The Band-Aids or dressings may be removed the day after surgery.  Should the incision become sore, red, and swollen after the first week, check with your doctor.  Personal hygiene:  Shower the day after your procedure.  Activity and limitations:  Do NOT drive or operate any equipment today.  Do NOT lift anything more than 15 pounds for 2-3 weeks after surgery.  Do NOT rest in bed all day.  Walking is encouraged. Walk each day, starting slowly with 5-minute walks 3 or 4 times a day. Slowly increase the length of your walks.  Walk up and down stairs slowly.  Do NOT do strenuous activities, such as golfing, playing tennis, bowling, running, biking, weight lifting, gardening, mowing, or vacuuming for 2-4 weeks. Ask your doctor when it is okay to start.  Diet: Eat a light meal as desired this evening. You may resume your usual diet tomorrow.  Return to work: This is dependent on the type of work you do. For the most part you can return to a desk job within a week of surgery. If you are more active at work, please discuss this with your doctor.  What to expect after your surgery: You may have a slight burning sensation when you urinate on the first day. You may have a very small amount of blood in the urine. Expect to have a small amount of vaginal discharge/light bleeding for 1-2 weeks. It is not unusual to have abdominal soreness and bruising for up to 2 weeks. You may be tired and need more rest for about 1 week. You may experience shoulder pain for  24-72 hours. Lying flat in bed may relieve it.  Call your doctor for any of the following:  Develop a fever of 100.4 or greater  Inability to urinate 6 hours after discharge from hospital  Severe pain not relieved by pain medications  Persistent of heavy bleeding at incision site  Redness or swelling around incision site after a week  Increasing nausea or vomiting  Patient Signature________________________________________ Nurse Signature_________________________________________   Post Anesthesia Home Care Instructions  NO IBUPROFEN PRODUCTS UNTIL: 8:15 PM TONIGHT  Activity: Get plenty of rest for the remainder of the day. A responsible individual must stay with you for 24 hours following the procedure.  For the next 24 hours, DO NOT: -Drive a car -Advertising copywriterperate machinery -Drink alcoholic beverages -Take any medication unless instructed by your physician -Make any legal decisions or sign important papers.  Meals: Start with liquid foods such as gelatin or soup. Progress to regular foods as tolerated. Avoid greasy, spicy, heavy foods. If nausea and/or vomiting occur, drink only clear liquids until the nausea and/or vomiting subsides. Call your physician if vomiting continues.  Special Instructions/Symptoms: Your throat may feel dry or sore from the anesthesia or the breathing tube placed in your throat during surgery. If this causes discomfort, gargle with warm salt water. The discomfort should disappear within 24 hours.  If you had a scopolamine patch placed behind your ear for the management of post- operative nausea  and/or vomiting:  1. The medication in the patch is effective for 72 hours, after which it should be removed.  Wrap patch in a tissue and discard in the trash. Wash hands thoroughly with soap and water. 2. You may remove the patch earlier than 72 hours if you experience unpleasant side effects which may include dry mouth, dizziness or visual disturbances. 3. Avoid  touching the patch. Wash your hands with soap and water after contact with the patch.

## 2018-05-09 NOTE — Progress Notes (Signed)
No change to H&P.  Brandi Mccay, DO 

## 2018-05-09 NOTE — Anesthesia Preprocedure Evaluation (Signed)
Anesthesia Evaluation  Patient identified by MRN, date of birth, ID band Patient awake    Reviewed: Allergy & Precautions, NPO status , Patient's Chart, lab work & pertinent test results  Airway Mallampati: II  TM Distance: >3 FB Neck ROM: Full    Dental no notable dental hx.    Pulmonary neg pulmonary ROS, former smoker,    Pulmonary exam normal breath sounds clear to auscultation       Cardiovascular negative cardio ROS Normal cardiovascular exam Rhythm:Regular Rate:Normal     Neuro/Psych negative neurological ROS  negative psych ROS   GI/Hepatic negative GI ROS, Neg liver ROS,   Endo/Other  negative endocrine ROS  Renal/GU negative Renal ROS  negative genitourinary   Musculoskeletal negative musculoskeletal ROS (+)   Abdominal   Peds negative pediatric ROS (+)  Hematology negative hematology ROS (+)   Anesthesia Other Findings   Reproductive/Obstetrics (+) Pregnancy                             Anesthesia Physical Anesthesia Plan  ASA: II  Anesthesia Plan: General   Post-op Pain Management:    Induction: Intravenous  PONV Risk Score and Plan: 4 or greater and Ondansetron, Dexamethasone, Midazolam, Scopolamine patch - Pre-op and Treatment may vary due to age or medical condition  Airway Management Planned: Oral ETT  Additional Equipment:   Intra-op Plan:   Post-operative Plan: Extubation in OR  Informed Consent: I have reviewed the patients History and Physical, chart, labs and discussed the procedure including the risks, benefits and alternatives for the proposed anesthesia with the patient or authorized representative who has indicated his/her understanding and acceptance.   Dental advisory given  Plan Discussed with: CRNA  Anesthesia Plan Comments:         Anesthesia Quick Evaluation

## 2018-05-09 NOTE — Anesthesia Procedure Notes (Addendum)
Procedure Name: Intubation Date/Time: 05/09/2018 1:10 PM Performed by: Montez Hageman, MD Pre-anesthesia Checklist: Patient identified, Emergency Drugs available, Suction available, Patient being monitored and Timeout performed Patient Re-evaluated:Patient Re-evaluated prior to induction Oxygen Delivery Method: Simple face mask Preoxygenation: Pre-oxygenation with 100% oxygen Induction Type: IV induction Ventilation: Oral airway inserted - appropriate to patient size Laryngoscope Size: Mac and 3 Grade View: Grade IV Tube type: Oral Tube size: 7.0 mm Number of attempts: 1 Airway Equipment and Method: Bougie stylet Placement Confirmation: ETT inserted through vocal cords under direct vision,  positive ETCO2 and breath sounds checked- equal and bilateral Secured at: 22 cm Tube secured with: Tape Dental Injury: Teeth and Oropharynx as per pre-operative assessment  Difficulty Due To: Difficult Airway- due to anterior larynx Future Recommendations: Recommend- induction with short-acting agent, and alternative techniques readily available Comments: SRNA (myself) encounters receded chin and anterior airway. Pt is Grade IV view on DL with MAC style size 3 blade. BURP applied with corniculate cartilage only visible and no glottic aperture. Asked for bougie and place blindly with tracheal rings encountered. Placed 7.0 over bougie without any resistance or difficulty. Thank you.

## 2018-05-10 ENCOUNTER — Encounter (HOSPITAL_COMMUNITY): Payer: Self-pay | Admitting: Obstetrics & Gynecology

## 2018-05-10 NOTE — Anesthesia Postprocedure Evaluation (Signed)
Anesthesia Post Note  Patient: Geroge BasemanHeather M Hoefling  Procedure(s) Performed: DIAGNOSTIC LAPAROSCOPY WITH REMOVAL OF ECTOPIC PREGNANCY (Bilateral Abdomen) LAPAROSCOPIC BILATERAL SALPINGECTOMY (Bilateral Abdomen)     Patient location during evaluation: PACU Anesthesia Type: General Level of consciousness: awake and alert Pain management: pain level controlled Vital Signs Assessment: post-procedure vital signs reviewed and stable Respiratory status: spontaneous breathing, nonlabored ventilation, respiratory function stable and patient connected to nasal cannula oxygen Cardiovascular status: blood pressure returned to baseline and stable Postop Assessment: no apparent nausea or vomiting Anesthetic complications: no    Last Vitals:  Vitals:   05/09/18 1515 05/09/18 1552  BP: (!) 110/55 (!) 115/54  Pulse: 84 80  Resp: 13 16  Temp: 37.2 C 37.1 C  SpO2: 97% 98%    Last Pain:  Vitals:   05/09/18 1515  TempSrc:   PainSc: 0-No pain                 Phillips Groutarignan, Levis Nazir

## 2019-05-31 ENCOUNTER — Other Ambulatory Visit: Payer: Self-pay

## 2019-05-31 ENCOUNTER — Ambulatory Visit: Payer: Self-pay

## 2019-05-31 ENCOUNTER — Other Ambulatory Visit: Payer: BLUE CROSS/BLUE SHIELD

## 2019-05-31 DIAGNOSIS — Z20822 Contact with and (suspected) exposure to covid-19: Secondary | ICD-10-CM

## 2019-05-31 NOTE — Telephone Encounter (Signed)
Incoming call from Dor[facility] of [facility] Requesting Patient be schedule for [employer].  Call to Patient.  Pt. Scheduled for 05/31/19 @ 945am.  Pt voices understanding.

## 2019-06-06 LAB — NOVEL CORONAVIRUS, NAA: SARS-CoV-2, NAA: NOT DETECTED

## 2019-07-16 ENCOUNTER — Other Ambulatory Visit: Payer: Self-pay

## 2019-07-16 DIAGNOSIS — Z20822 Contact with and (suspected) exposure to covid-19: Secondary | ICD-10-CM

## 2019-07-17 LAB — NOVEL CORONAVIRUS, NAA: SARS-CoV-2, NAA: NOT DETECTED

## 2019-10-02 ENCOUNTER — Telehealth: Payer: Self-pay | Admitting: Physician Assistant

## 2019-10-02 NOTE — Telephone Encounter (Signed)
LM asking pt to call back to let us know if she still plans on using [facility] as her PCP and if so to please schedule a CPE appt with him. ELEA  °

## 2019-10-10 ENCOUNTER — Other Ambulatory Visit: Payer: Self-pay

## 2019-10-10 DIAGNOSIS — Z20822 Contact with and (suspected) exposure to covid-19: Secondary | ICD-10-CM

## 2019-10-11 LAB — NOVEL CORONAVIRUS, NAA: SARS-CoV-2, NAA: NOT DETECTED

## 2019-10-16 ENCOUNTER — Other Ambulatory Visit: Payer: Self-pay

## 2019-10-16 DIAGNOSIS — Z20822 Contact with and (suspected) exposure to covid-19: Secondary | ICD-10-CM

## 2019-10-18 LAB — NOVEL CORONAVIRUS, NAA: SARS-CoV-2, NAA: NOT DETECTED

## 2020-04-22 ENCOUNTER — Other Ambulatory Visit: Payer: Self-pay | Admitting: Obstetrics and Gynecology

## 2020-04-22 DIAGNOSIS — E041 Nontoxic single thyroid nodule: Secondary | ICD-10-CM

## 2020-05-02 ENCOUNTER — Ambulatory Visit
Admission: RE | Admit: 2020-05-02 | Discharge: 2020-05-02 | Disposition: A | Discharge status: Home / Self Care | Payer: BC Managed Care – PPO | Source: Ambulatory Visit | Attending: Obstetrics and Gynecology | Admitting: Obstetrics and Gynecology

## 2020-05-02 DIAGNOSIS — E041 Nontoxic single thyroid nodule: Secondary | ICD-10-CM

## 2020-11-22 NOTE — L&D Delivery Note (Signed)
Delivery Note At 1:20 PM a viable female was delivered via Vaginal, Spontaneous (Presentation:   LOA  ).  APGAR: pending ; weight pending .   Placenta status:  routine .  Cord: 3 vessels with the following complications: Short.  Cord pH: not sent  Anesthesia: Epidural Episiotomy:  None Lacerations:  None Suture Repair:  None Est. Blood Loss (mL):  200cc   It's a girl - "Brandi Burns"!!  Mom to  postpartum with [name] .  Baby to Couplet care / Skin to Skin.  [name] 11/05/2021, 1:37 PM

## 2021-01-19 ENCOUNTER — Encounter (HOSPITAL_BASED_OUTPATIENT_CLINIC_OR_DEPARTMENT_OTHER): Payer: Self-pay | Admitting: Obstetrics and Gynecology

## 2021-01-19 ENCOUNTER — Other Ambulatory Visit: Payer: Self-pay

## 2021-01-19 NOTE — H&P (Signed)
Brandi Burns is an 35 y.o. female. With incidentally found polyp while undergoing pre-testing for IVF transfer. 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 She has been off of birth control pills and having regular timed periods. The polyp measured 1cm and had blood flow at the stalk.   Pertinent Gynecological History: Menses: flow is light Bleeding: none Contraception: tubal ligation and with salpongectomy DES exposure: denies Blood transfusions: none Sexually transmitted diseases: no past history Previous GYN Procedures: n/a  Last mammogram: n/a Last pap: normal Date: 2020 OB History: G4, P3013 (ectopic x 1), SVD x 3   Menstrual History: No LMP recorded.    Past Medical History:  Diagnosis Date  . Abnormal Pap smear 2011  . Anxiety   . Depression   . H/O thyroid nodule    MD just watching, no treatment/no meds  . HPV in female   . Seasonal allergies   . SVD (spontaneous vaginal delivery)    x 3  . Thyroid disease    Nodules- MD just watching, no treatments/no meds  . Thyroid nodule   . Vaginal Pap smear, abnormal     Past Surgical History:  Procedure Laterality Date  . CHOLECYSTECTOMY  2011  . COLPOSCOPY    . DIAGNOSTIC LAPAROSCOPY WITH REMOVAL OF ECTOPIC PREGNANCY Bilateral 05/09/2018   Procedure: DIAGNOSTIC LAPAROSCOPY WITH REMOVAL OF ECTOPIC PREGNANCY;  Surgeon: Mitchel Honour, DO;  Location: [facility];  Service: Gynecology;  Laterality: Bilateral;  bialteral salpingectomy  . LAPAROSCOPIC BILATERAL SALPINGECTOMY Bilateral 05/09/2018   Procedure: LAPAROSCOPIC BILATERAL SALPINGECTOMY;  Surgeon: Mitchel Honour, DO;  Location: [facility];  Service: Gynecology;  Laterality: Bilateral;  . LAPAROSCOPIC TUBAL LIGATION Bilateral 03/17/2018   Procedure: LAPAROSCOPIC TUBAL LIGATION WITH FILSHIE CLIPS;  Surgeon: [name], MD;  Location: [facility];  Service: Gynecology;  Laterality: Bilateral;  . TUBAL LIGATION      Family History  Problem Relation  Age of Onset  . Heart disease Maternal Uncle   . Heart attack Maternal Uncle 30  . Emphysema Maternal Grandmother        Smoker  . Thyroid disease Maternal Grandmother   . Aneurysm Maternal Grandmother        AAA -- s/p 2 surgical procedures  . Hyperlipidemia Maternal Grandmother   . Cancer Maternal Grandmother        Skin  . Heart disease Maternal Grandfather        5 bypass  . Diabetes Maternal Grandfather   . Hypertension Maternal Grandfather   . Healthy Son   . ADD / ADHD Son   . Anemia Son   . Asthma Son   . Healthy Daughter     Social History:  reports that she quit smoking about 5 years ago. Her smoking use included cigarettes. She has a 5.00 pack-year smoking history. She has never used smokeless tobacco. She reports current alcohol use. She reports that she does not use drugs.  Allergies:  Allergies  Allergen Reactions  . Wellbutrin [Bupropion Hcl] Hives    No medications prior to admission.    Review of Systems  There were no vitals taken for this visit. Physical Exam Gen: well appearing, NAD CV: Reg rate Pulm: NWOB Abd: soft, nondistended, nontender, no masses GYN: uterus 7 week size, no adnexa ttp/CMT Ext: No edema b/l  No results found for this or any previous visit (from the past 24 hour(s)).  No results found.  Assessment/Plan: 35 yo presenting for removal of known 1cm endometrial  polyp. Risks discussed including infection, bleeding, damage to surrounding structures, need for additional procedures, postoperative DVT and fluid overload. All questions answered. Consent signed in the office.    [name] 01/19/2021, 3:36 PM

## 2021-01-20 ENCOUNTER — Encounter (HOSPITAL_BASED_OUTPATIENT_CLINIC_OR_DEPARTMENT_OTHER)
Admission: RE | Admit: 2021-01-20 | Discharge: 2021-01-20 | Disposition: A | Discharge status: Home / Self Care | Payer: 59 | Source: Ambulatory Visit | Attending: Obstetrics and Gynecology | Admitting: Obstetrics and Gynecology

## 2021-01-20 ENCOUNTER — Other Ambulatory Visit (HOSPITAL_COMMUNITY)
Admission: RE | Admit: 2021-01-20 | Discharge: 2021-01-20 | Disposition: A | Discharge status: Home / Self Care | Payer: 59 | Source: Ambulatory Visit | Attending: Obstetrics and Gynecology | Admitting: Obstetrics and Gynecology

## 2021-01-20 DIAGNOSIS — Z01812 Encounter for preprocedural laboratory examination: Secondary | ICD-10-CM | POA: Insufficient documentation

## 2021-01-20 DIAGNOSIS — Z20822 Contact with and (suspected) exposure to covid-19: Secondary | ICD-10-CM | POA: Diagnosis not present

## 2021-01-20 LAB — TYPE AND SCREEN
ABO/RH(D): O POS
Antibody Screen: NEGATIVE

## 2021-01-20 LAB — SARS CORONAVIRUS 2 (TAT 6-24 HRS): SARS Coronavirus 2: NEGATIVE

## 2021-01-22 ENCOUNTER — Ambulatory Visit (HOSPITAL_BASED_OUTPATIENT_CLINIC_OR_DEPARTMENT_OTHER)
Admission: RE | Admit: 2021-01-22 | Discharge: 2021-01-22 | Disposition: A | Discharge status: Home / Self Care | Payer: 59 | Attending: Obstetrics and Gynecology | Admitting: Obstetrics and Gynecology

## 2021-01-22 ENCOUNTER — Encounter (HOSPITAL_BASED_OUTPATIENT_CLINIC_OR_DEPARTMENT_OTHER): Payer: Self-pay | Admitting: Obstetrics and Gynecology

## 2021-01-22 ENCOUNTER — Encounter (HOSPITAL_BASED_OUTPATIENT_CLINIC_OR_DEPARTMENT_OTHER)
Admission: RE | Disposition: A | Discharge status: Home / Self Care | Payer: Self-pay | Source: Home / Self Care | Attending: Obstetrics and Gynecology

## 2021-01-22 ENCOUNTER — Ambulatory Visit (HOSPITAL_BASED_OUTPATIENT_CLINIC_OR_DEPARTMENT_OTHER): Payer: 59 | Admitting: Certified Registered"

## 2021-01-22 ENCOUNTER — Other Ambulatory Visit: Payer: Self-pay

## 2021-01-22 DIAGNOSIS — Z8619 Personal history of other infectious and parasitic diseases: Secondary | ICD-10-CM | POA: Insufficient documentation

## 2021-01-22 DIAGNOSIS — Z9049 Acquired absence of other specified parts of digestive tract: Secondary | ICD-10-CM | POA: Insufficient documentation

## 2021-01-22 DIAGNOSIS — Z87891 Personal history of nicotine dependence: Secondary | ICD-10-CM | POA: Insufficient documentation

## 2021-01-22 DIAGNOSIS — Z888 Allergy status to other drugs, medicaments and biological substances status: Secondary | ICD-10-CM | POA: Insufficient documentation

## 2021-01-22 DIAGNOSIS — N840 Polyp of corpus uteri: Secondary | ICD-10-CM | POA: Diagnosis present

## 2021-01-22 DIAGNOSIS — Z90722 Acquired absence of ovaries, bilateral: Secondary | ICD-10-CM | POA: Diagnosis not present

## 2021-01-22 HISTORY — PX: HYSTEROSCOPY: SHX211

## 2021-01-22 LAB — POCT PREGNANCY, URINE: Preg Test, Ur: NEGATIVE

## 2021-01-22 SURGERY — HYSTEROSCOPY
Anesthesia: General | Site: Vagina

## 2021-01-22 MED ORDER — LIDOCAINE HCL 1 % IJ SOLN
INTRAMUSCULAR | Status: DC | PRN
  Administered 2021-01-22: 10 mL

## 2021-01-22 MED ORDER — ATROPINE SULFATE 0.4 MG/ML IJ SOLN
INTRAMUSCULAR | Status: AC
  Filled 2021-01-22: qty 1

## 2021-01-22 MED ORDER — FENTANYL CITRATE (PF) 100 MCG/2ML IJ SOLN
INTRAMUSCULAR | Status: AC
  Filled 2021-01-22: qty 2

## 2021-01-22 MED ORDER — ONDANSETRON HCL 4 MG/2ML IJ SOLN
INTRAMUSCULAR | Status: DC | PRN
  Administered 2021-01-22: 4 mg via INTRAVENOUS

## 2021-01-22 MED ORDER — SUCCINYLCHOLINE CHLORIDE 200 MG/10ML IV SOSY
PREFILLED_SYRINGE | INTRAVENOUS | Status: AC
  Filled 2021-01-22: qty 10

## 2021-01-22 MED ORDER — LIDOCAINE HCL (CARDIAC) PF 100 MG/5ML IV SOSY
PREFILLED_SYRINGE | INTRAVENOUS | Status: DC | PRN
  Administered 2021-01-22: 60 mg via INTRAVENOUS

## 2021-01-22 MED ORDER — PROPOFOL 10 MG/ML IV BOLUS
INTRAVENOUS | Status: AC
  Filled 2021-01-22: qty 40

## 2021-01-22 MED ORDER — PROPOFOL 10 MG/ML IV BOLUS
INTRAVENOUS | Status: DC | PRN
  Administered 2021-01-22: 200 mg via INTRAVENOUS
  Administered 2021-01-22: 50 mg via INTRAVENOUS

## 2021-01-22 MED ORDER — FENTANYL CITRATE (PF) 100 MCG/2ML IJ SOLN
INTRAMUSCULAR | Status: DC | PRN
  Administered 2021-01-22: 50 ug via INTRAVENOUS

## 2021-01-22 MED ORDER — ONDANSETRON HCL 4 MG/2ML IJ SOLN
INTRAMUSCULAR | Status: AC
  Filled 2021-01-22: qty 2

## 2021-01-22 MED ORDER — SODIUM CHLORIDE 0.9 % IR SOLN
Status: DC | PRN
  Administered 2021-01-22: 3000 mL

## 2021-01-22 MED ORDER — ACETAMINOPHEN 500 MG PO TABS
ORAL_TABLET | ORAL | Status: AC
  Filled 2021-01-22: qty 2

## 2021-01-22 MED ORDER — DEXAMETHASONE SODIUM PHOSPHATE 10 MG/ML IJ SOLN
INTRAMUSCULAR | Status: AC
  Filled 2021-01-22: qty 1

## 2021-01-22 MED ORDER — DEXAMETHASONE SODIUM PHOSPHATE 10 MG/ML IJ SOLN
INTRAMUSCULAR | Status: DC | PRN
  Administered 2021-01-22: 10 mg via INTRAVENOUS

## 2021-01-22 MED ORDER — LACTATED RINGERS IV SOLN: INTRAVENOUS | Status: DC

## 2021-01-22 MED ORDER — MIDAZOLAM HCL 5 MG/5ML IJ SOLN
INTRAMUSCULAR | Status: DC | PRN
  Administered 2021-01-22: 2 mg via INTRAVENOUS

## 2021-01-22 MED ORDER — FENTANYL CITRATE (PF) 100 MCG/2ML IJ SOLN: 25.0000 ug | INTRAMUSCULAR | Status: DC | PRN

## 2021-01-22 MED ORDER — PROPOFOL 10 MG/ML IV BOLUS
INTRAVENOUS | Status: AC
  Filled 2021-01-22: qty 20

## 2021-01-22 MED ORDER — MIDAZOLAM HCL 2 MG/2ML IJ SOLN
INTRAMUSCULAR | Status: AC
  Filled 2021-01-22: qty 2

## 2021-01-22 MED ORDER — AMISULPRIDE (ANTIEMETIC) 5 MG/2ML IV SOLN: 10.0000 mg | Freq: Once | INTRAVENOUS | Status: DC | PRN

## 2021-01-22 MED ORDER — SCOPOLAMINE 1 MG/3DAYS TD PT72
1.0000 | MEDICATED_PATCH | TRANSDERMAL | Status: DC
  Administered 2021-01-22: 1.5 mg via TRANSDERMAL

## 2021-01-22 MED ORDER — LIDOCAINE 2% (20 MG/ML) 5 ML SYRINGE
INTRAMUSCULAR | Status: AC
  Filled 2021-01-22: qty 5

## 2021-01-22 MED ORDER — SILVER NITRATE-POT NITRATE 75-25 % EX MISC
CUTANEOUS | Status: AC
  Filled 2021-01-22: qty 10

## 2021-01-22 MED ORDER — SCOPOLAMINE 1 MG/3DAYS TD PT72
MEDICATED_PATCH | TRANSDERMAL | Status: AC
  Filled 2021-01-22: qty 1

## 2021-01-22 MED ORDER — LIDOCAINE HCL (PF) 1 % IJ SOLN
INTRAMUSCULAR | Status: AC
  Filled 2021-01-22: qty 30

## 2021-01-22 MED ORDER — ACETAMINOPHEN 500 MG PO TABS
1000.0000 mg | ORAL_TABLET | Freq: Once | ORAL | Status: AC
  Administered 2021-01-22: 1000 mg via ORAL

## 2021-01-22 MED ORDER — POVIDONE-IODINE 10 % EX SWAB
2.0000 "application " | Freq: Once | CUTANEOUS | Status: AC
  Administered 2021-01-22: 2 via TOPICAL

## 2021-01-22 SURGICAL SUPPLY — 20 items
CATH ROBINSON RED A/P 16FR (CATHETERS) IMPLANT
DEVICE MYOSURE LITE (MISCELLANEOUS) IMPLANT
DEVICE MYOSURE REACH (MISCELLANEOUS) IMPLANT
DILATOR CANAL MILEX (MISCELLANEOUS) IMPLANT
GAUZE 4X4 16PLY RFD (DISPOSABLE) ×3 IMPLANT
GLOVE SURG ENC MOIS LTX SZ6.5 (GLOVE) ×3 IMPLANT
GLOVE SURG UNDER POLY LF SZ6.5 (GLOVE) ×3 IMPLANT
GLOVE SURG UNDER POLY LF SZ7 (GLOVE) ×3 IMPLANT
GOWN STRL REUS W/ TWL LRG LVL3 (GOWN DISPOSABLE) ×4 IMPLANT
GOWN STRL REUS W/TWL LRG LVL3 (GOWN DISPOSABLE) ×4
KIT PROCEDURE FLUENT (KITS) ×3 IMPLANT
KIT TURNOVER KIT B (KITS) ×3 IMPLANT
MYOSURE XL FIBROID REM (MISCELLANEOUS)
PACK VAGINAL MINOR WOMEN LF (CUSTOM PROCEDURE TRAY) ×3 IMPLANT
PAD OB MATERNITY 4.3X12.25 (PERSONAL CARE ITEMS) ×3 IMPLANT
PAD PREP 24X48 CUFFED NSTRL (MISCELLANEOUS) ×3 IMPLANT
SEAL ROD LENS SCOPE MYOSURE (ABLATOR) ×3 IMPLANT
SLEEVE SCD COMPRESS KNEE MED (STOCKING) ×3 IMPLANT
SYSTEM TISS REMOVAL MYSR XL RM (MISCELLANEOUS) IMPLANT
TOWEL GREEN STERILE FF (TOWEL DISPOSABLE) ×6 IMPLANT

## 2021-01-22 NOTE — Op Note (Signed)
No updates to above H&P. Patient arrived NPO and was consented in PACU. Risks again discussed, all questions answered, and consent signed. Proceed with above surgery.    Denya Buckingham MD  

## 2021-01-22 NOTE — Anesthesia Procedure Notes (Signed)
Procedure Name: LMA Insertion Date/Time: 01/22/2021 7:39 AM Performed by: Lauralyn Primes, CRNA Pre-anesthesia Checklist: Patient identified, Emergency Drugs available, Suction available and Patient being monitored Patient Re-evaluated:Patient Re-evaluated prior to induction Oxygen Delivery Method: Circle system utilized Preoxygenation: Pre-oxygenation with 100% oxygen Induction Type: IV induction Ventilation: Mask ventilation without difficulty LMA: LMA inserted LMA Size: 4.0 Number of attempts: 1 Airway Equipment and Method: Bite block Placement Confirmation: positive ETCO2 Tube secured with: Tape Dental Injury: Teeth and Oropharynx as per pre-operative assessment

## 2021-01-22 NOTE — Op Note (Signed)
PREOPERATIVE DIAGNOSES: 1. Endometrial polyp  POSTOPERATIVE DIAGNOSES: Same  PROCEDURE PERFORMED: Dilation, polypectomy, hysteroscopy  SURGEON: Dr. Lucillie Garfinkel  ANESTHESIA: MAC  ESTIMATED BLOOD LOSS: 10cc.  COMPLICATIONS: None  TUBES: None.  DRAINS: None  PATHOLOGY:  polyp  FINDINGS: On exam, under anesthesia, normal appearing vulva and vagina, 7 week sized uterus  Operative findings demonstrated mostly normal endometrium and a tiny possible polyp. B/l ostia visualized  Procedure: The patient was taken to the operating room where she was properly prepped and draped in sterile manner under general anesthesia. After bimanual examination, the cervix was exposed with a weighted vaginal speculum and the anterior lip of the cervix grasped with a tenaculum.  The endocervical canal was then progressively dilated to 53m. The hysteroscope was then introduced into the uterine cavity using sterile saline solution as a distending media and with attached video camera. The endometrial cavity was distended with fluids and the cavity with the b/l ostia were visualized. A tiny possible polyp (230m was noted that was easily and completely removed with polyp graspers. Several pictures were taken of the endometrial cavity and the hysteroscope removed from the cavity. The sponge and lap counts were correct times 2 at this time. The patient's procedure was terminated. We then awakened her. She was sent to the Recovery Room in good condition.    ElLucillie GarfinkelD

## 2021-01-22 NOTE — Anesthesia Postprocedure Evaluation (Signed)
Anesthesia Post Note  Patient: Brandi Burns  Procedure(s) Performed: HYSTEROSCOPY (N/A Vagina )     Patient location during evaluation: PACU Anesthesia Type: General Level of consciousness: awake and alert Pain management: pain level controlled Vital Signs Assessment: post-procedure vital signs reviewed and stable Respiratory status: spontaneous breathing, nonlabored ventilation, respiratory function stable and patient connected to nasal cannula oxygen Cardiovascular status: blood pressure returned to baseline and stable Postop Assessment: no apparent nausea or vomiting Anesthetic complications: no   No complications documented.  Last Vitals:  Vitals:   01/22/21 0830 01/22/21 0845  BP: 117/64 115/75  Pulse: 72 65  Resp: 15 14  Temp:  36.7 C  SpO2: 100% 100%    Last Pain:  Vitals:   01/22/21 0845  TempSrc:   PainSc: 0-No pain                 Kennieth Rad

## 2021-01-22 NOTE — Anesthesia Preprocedure Evaluation (Signed)
Anesthesia Evaluation  Patient identified by MRN, date of birth, ID band Patient awake    Reviewed: Allergy & Precautions, NPO status , Patient's Chart, lab work & pertinent test results  Airway Mallampati: II  TM Distance: >3 FB Neck ROM: Full    Dental  (+) Dental Advisory Given   Pulmonary former smoker,    breath sounds clear to auscultation       Cardiovascular negative cardio ROS   Rhythm:Regular Rate:Normal     Neuro/Psych negative neurological ROS     GI/Hepatic negative GI ROS, Neg liver ROS,   Endo/Other  negative endocrine ROS  Renal/GU negative Renal ROS     Musculoskeletal   Abdominal   Peds  Hematology negative hematology ROS (+)   Anesthesia Other Findings   Reproductive/Obstetrics                             Anesthesia Physical Anesthesia Plan  ASA: I  Anesthesia Plan: General   Post-op Pain Management:    Induction: Intravenous  PONV Risk Score and Plan: 4 or greater and Ondansetron, Dexamethasone, Midazolam, Scopolamine patch - Pre-op and Treatment may vary due to age or medical condition  Airway Management Planned: LMA  Additional Equipment:   Intra-op Plan:   Post-operative Plan: Extubation in OR  Informed Consent: I have reviewed the patients History and Physical, chart, labs and discussed the procedure including the risks, benefits and alternatives for the proposed anesthesia with the patient or authorized representative who has indicated his/her understanding and acceptance.     Dental advisory given  Plan Discussed with: CRNA  Anesthesia Plan Comments:         Anesthesia Quick Evaluation

## 2021-01-22 NOTE — Transfer of Care (Signed)
Immediate Anesthesia Transfer of Care Note  Patient: Brandi Burns  Procedure(s) Performed: HYSTEROSCOPY (N/A Vagina )  Patient Location: PACU  Anesthesia Type:General  Level of Consciousness: drowsy  Airway & Oxygen Therapy: Patient Spontanous Breathing and Patient connected to face mask oxygen  Post-op Assessment: Report given to RN and Post -op Vital signs reviewed and stable  Post vital signs: Reviewed and stable  Last Vitals:  Vitals Value Taken Time  BP 126/79 01/22/21 0810  Temp    Pulse 105 01/22/21 0811  Resp 16 01/22/21 0811  SpO2 100 % 01/22/21 0811  Vitals shown include unvalidated device data.  Last Pain:  Vitals:   01/22/21 0645  TempSrc: Oral  PainSc: 0-No pain         Complications: No complications documented.

## 2021-01-22 NOTE — Discharge Instructions (Signed)
  No Tylenol until 12:53 pm  Post Anesthesia Home Care Instructions  Activity: Get plenty of rest for the remainder of the day. A responsible individual must stay with you for 24 hours following the procedure.  For the next 24 hours, DO NOT: -Drive a car -Advertising copywriter -Drink alcoholic beverages -Take any medication unless instructed by your physician -Make any legal decisions or sign important papers.  Meals: Start with liquid foods such as gelatin or soup. Progress to regular foods as tolerated. Avoid greasy, spicy, heavy foods. If nausea and/or vomiting occur, drink only clear liquids until the nausea and/or vomiting subsides. Call your physician if vomiting continues.  Special Instructions/Symptoms: Your throat may feel dry or sore from the anesthesia or the breathing tube placed in your throat during surgery. If this causes discomfort, gargle with warm salt water. The discomfort should disappear within 24 hours.  If you had a scopolamine patch placed behind your ear for the management of post- operative nausea and/or vomiting:  1. The medication in the patch is effective for 72 hours, after which it should be removed.  Wrap patch in a tissue and discard in the trash. Wash hands thoroughly with soap and water. 2. You may remove the patch earlier than 72 hours if you experience unpleasant side effects which may include dry mouth, dizziness or visual disturbances. 3. Avoid touching the patch. Wash your hands with soap and water after contact with the patch.

## 2021-01-23 ENCOUNTER — Encounter (HOSPITAL_BASED_OUTPATIENT_CLINIC_OR_DEPARTMENT_OTHER): Payer: Self-pay | Admitting: Obstetrics and Gynecology

## 2021-01-23 LAB — SURGICAL PATHOLOGY

## 2021-02-16 IMAGING — US US THYROID
1 series · 14 of 25 positions shown · non-contrast
Comparison: 11/14/2015

CLINICAL DATA: Thyroid nodule

EXAM:
THYROID ULTRASOUND
TECHNIQUE: Ultrasound examination of the thyroid gland and adjacent soft
tissues was performed.

[Series 1: us thyroid · 0.06mm/px · 14 of 40 slices shown]
[im 1/40]
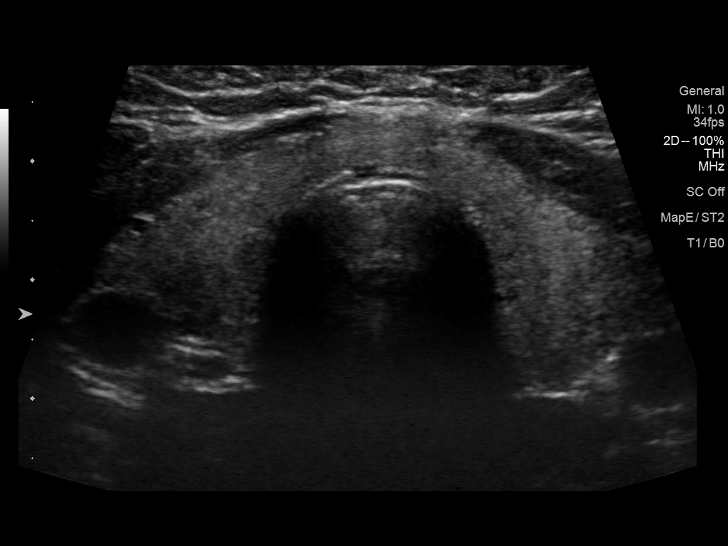
[im 4/40]
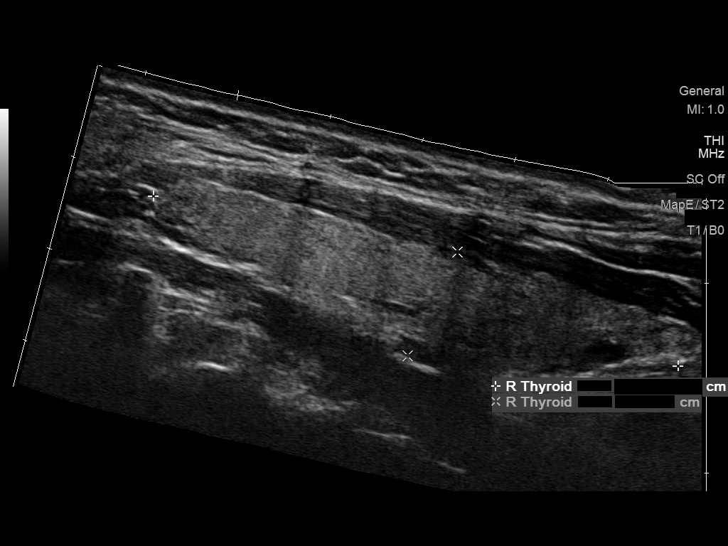
[im 7/40]
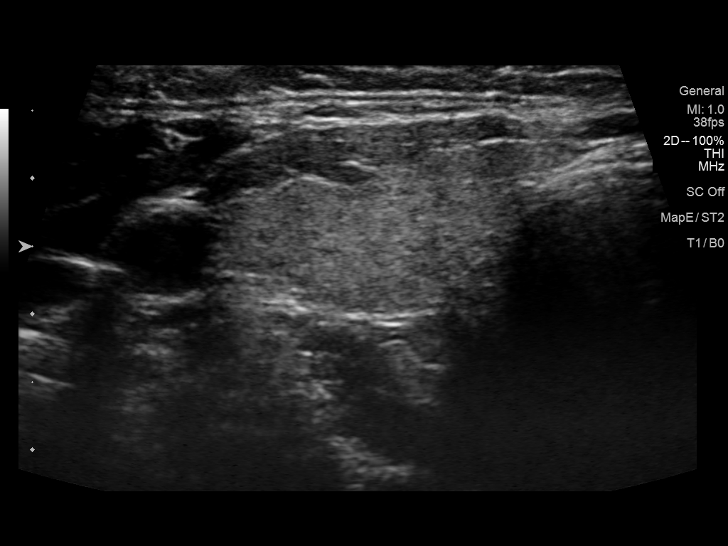
[im 10/40]
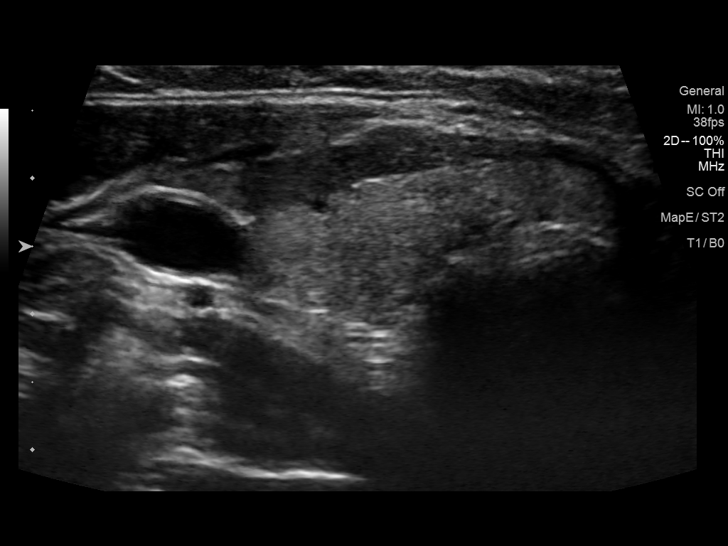
[im 14/40]
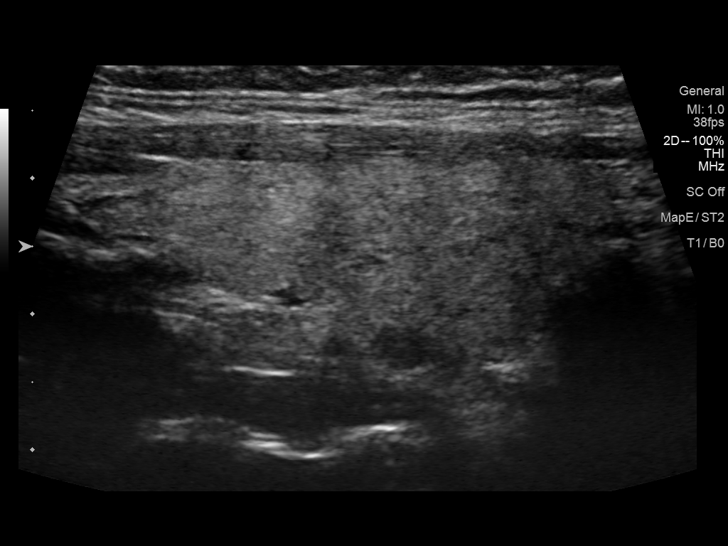
[im 15/40]
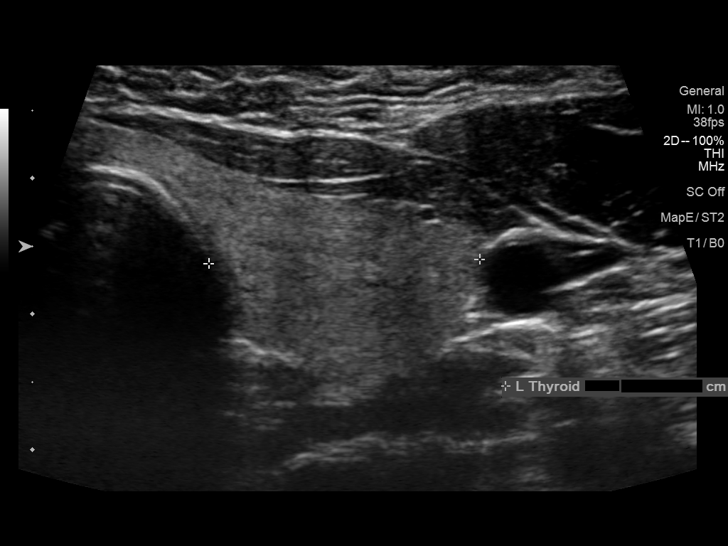
[im 18/40]
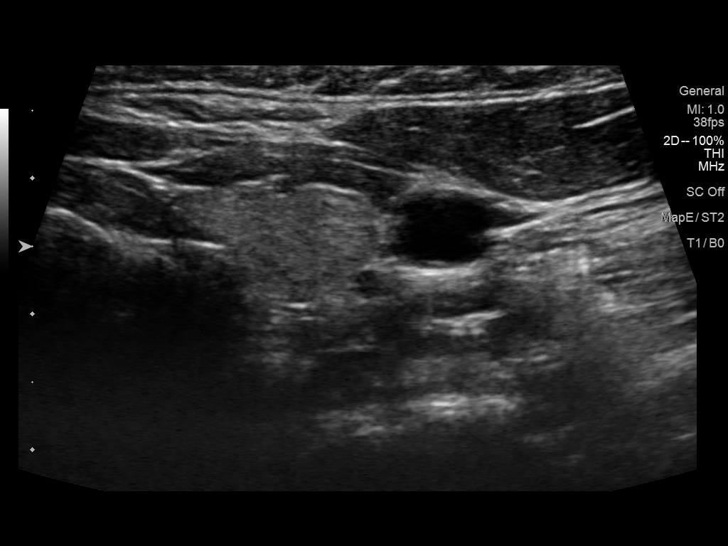
[im 22/40]
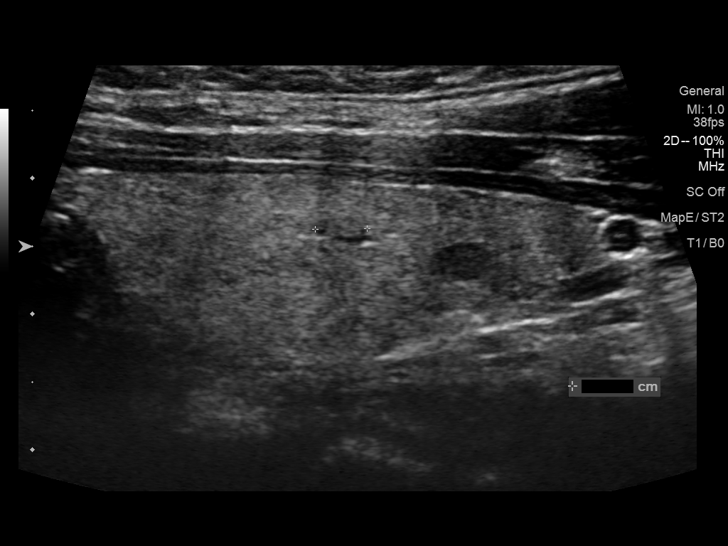
[im 25/40]
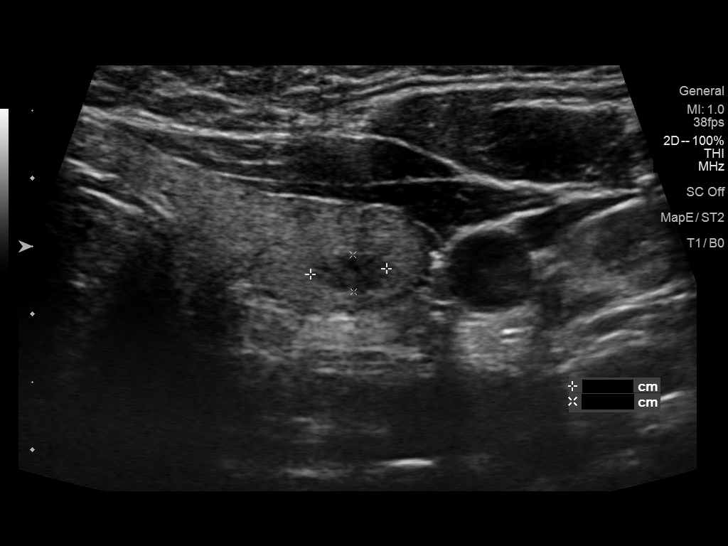
[im 27/40]
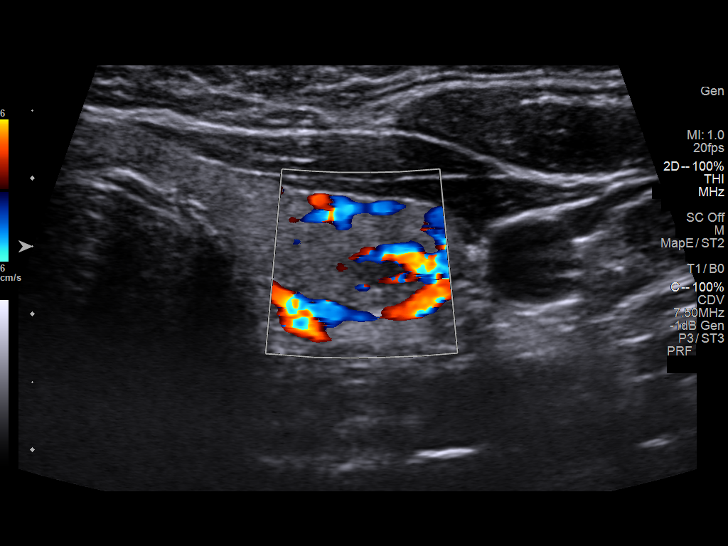
[im 30/40]
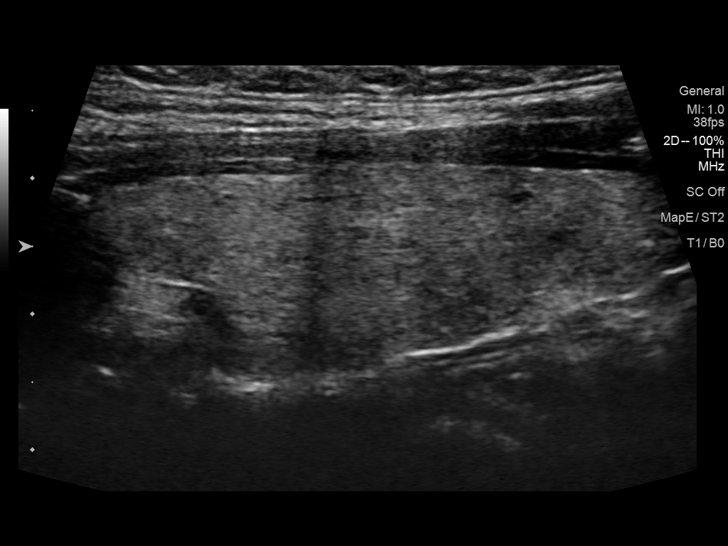
[im 33/40]
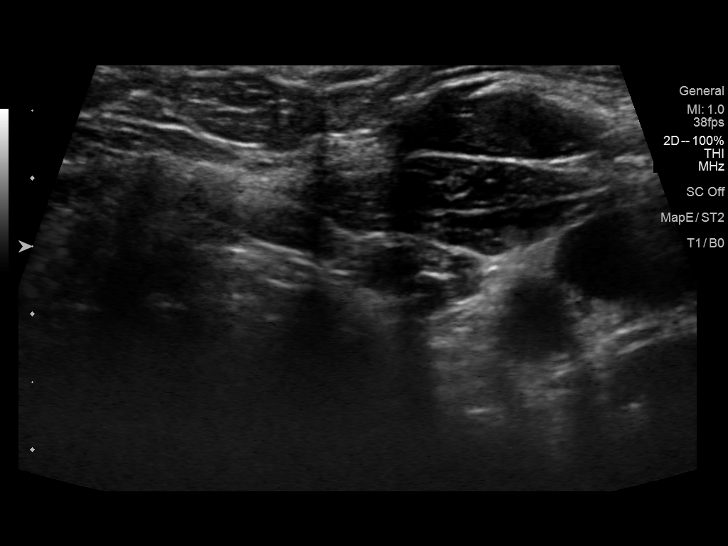
[im 36/40]
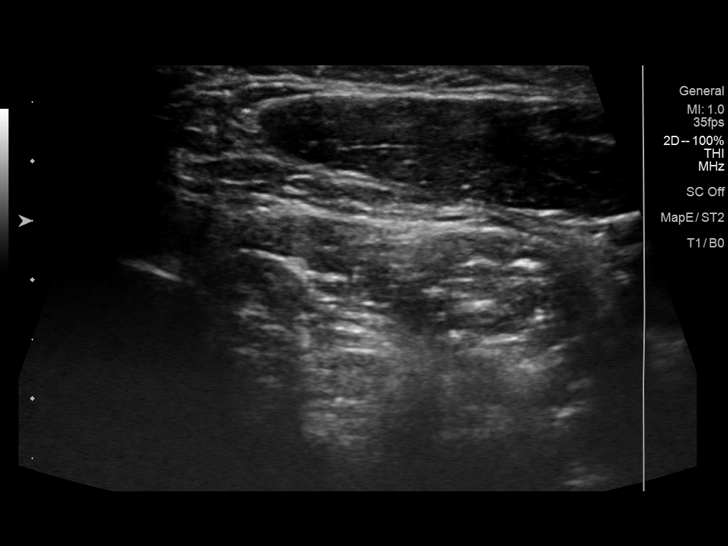
[im 40/40]
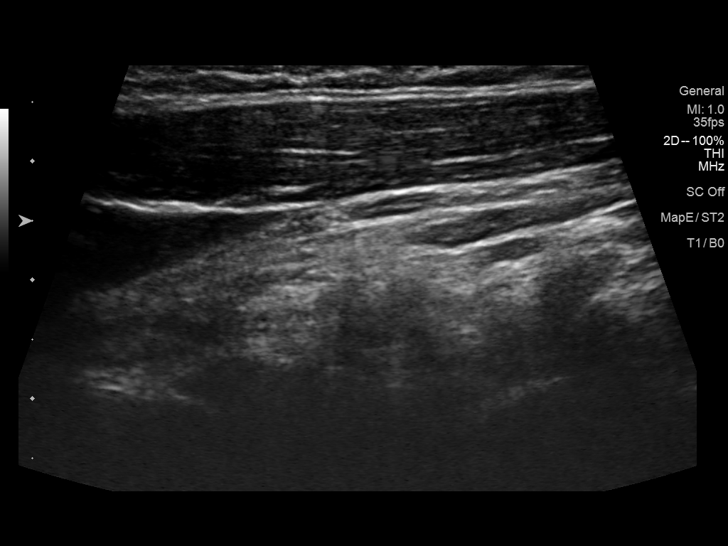

[14 of 25 positions shown; findings below may reference images not displayed]

FINDINGS: Parenchymal Echotexture: Mildly heterogenous

Isthmus: 4 mm

Right lobe: 5.8 x 1.2 x 1.8 cm

Left lobe: 4.7 x 1.6 x 2.0 cm

_________________________________________________________

Estimated total number of nodules >/= 1 cm: 0

Number of spongiform nodules >/=  2 cm not described below (TR1): 0

Number of mixed cystic and solid nodules >/= 1.5 cm not described
below (TR2): 0

_________________________________________________________

Similar nonspecific mild thyroid heterogeneity. No significant
change in bilateral subcentimeter hypoechoic and isoechoic nodules
appearing benign. These all measure 6 mm or less in size. No other
significant thyroid nodule or mass. No hypervascularity. No regional
adenopathy.
IMPRESSION: Stable nonspecific thyroid heterogeneity and subcentimeter benign
nodules. No significant abnormality that warrants follow-up or
biopsy.

The above is in keeping with the ACR TI-RADS recommendations - [HOSPITAL] 4431;[DATE].

## 2021-03-23 ENCOUNTER — Telehealth: Payer: Self-pay

## 2021-03-23 NOTE — Telephone Encounter (Signed)
Unable to LVM advising to do TOC. 

## 2021-04-14 LAB — OB RESULTS CONSOLE RPR: RPR: NONREACTIVE

## 2021-04-14 LAB — OB RESULTS CONSOLE GC/CHLAMYDIA
Chlamydia: NEGATIVE
Gonorrhea: NEGATIVE

## 2021-04-14 LAB — OB RESULTS CONSOLE RUBELLA ANTIBODY, IGM: Rubella: IMMUNE

## 2021-04-14 LAB — OB RESULTS CONSOLE HEPATITIS B SURFACE ANTIGEN: Hepatitis B Surface Ag: NEGATIVE

## 2021-04-14 LAB — OB RESULTS CONSOLE HIV ANTIBODY (ROUTINE TESTING): HIV: NONREACTIVE

## 2021-08-05 ENCOUNTER — Other Ambulatory Visit: Payer: Self-pay | Admitting: Obstetrics and Gynecology

## 2021-08-05 LAB — SARS CORONAVIRUS 2 (TAT 6-24 HRS): SARS Coronavirus 2: POSITIVE — AB

## 2021-10-06 ENCOUNTER — Other Ambulatory Visit: Payer: Self-pay

## 2021-10-06 ENCOUNTER — Inpatient Hospital Stay (HOSPITAL_COMMUNITY)
Admission: AD | Admit: 2021-10-06 | Discharge: 2021-10-06 | Disposition: A | Discharge status: Home / Self Care | Payer: 59 | Attending: Obstetrics and Gynecology | Admitting: Obstetrics and Gynecology

## 2021-10-06 ENCOUNTER — Encounter (HOSPITAL_COMMUNITY): Payer: Self-pay | Admitting: Obstetrics and Gynecology

## 2021-10-06 DIAGNOSIS — Z3A32 32 weeks gestation of pregnancy: Secondary | ICD-10-CM

## 2021-10-06 DIAGNOSIS — O10013 Pre-existing essential hypertension complicating pregnancy, third trimester: Secondary | ICD-10-CM | POA: Insufficient documentation

## 2021-10-06 DIAGNOSIS — O1490 Unspecified pre-eclampsia, unspecified trimester: Secondary | ICD-10-CM

## 2021-10-06 DIAGNOSIS — J101 Influenza due to other identified influenza virus with other respiratory manifestations: Secondary | ICD-10-CM

## 2021-10-06 DIAGNOSIS — Z20822 Contact with and (suspected) exposure to covid-19: Secondary | ICD-10-CM | POA: Diagnosis not present

## 2021-10-06 DIAGNOSIS — O009 Unspecified ectopic pregnancy without intrauterine pregnancy: Secondary | ICD-10-CM

## 2021-10-06 LAB — COMPREHENSIVE METABOLIC PANEL
ALT: 21 U/L (ref 0–44)
AST: 28 U/L (ref 15–41)
Albumin: 2.9 g/dL — ABNORMAL LOW (ref 3.5–5.0)
Alkaline Phosphatase: 149 U/L — ABNORMAL HIGH (ref 38–126)
Anion gap: 11 (ref 5–15)
BUN: <5 mg/dL — ABNORMAL LOW (ref 6–20)
CO2: 19 mmol/L — ABNORMAL LOW (ref 22–32)
Calcium: 8.7 mg/dL — ABNORMAL LOW (ref 8.9–10.3)
Chloride: 103 mmol/L (ref 98–111)
Creatinine, Ser: 0.57 mg/dL (ref 0.44–1.00)
GFR, Estimated: >60 mL/min (ref >60)
Glucose, Bld: 78 mg/dL (ref 70–99)
Potassium: 3.5 mmol/L (ref 3.5–5.1)
Sodium: 133 mmol/L — ABNORMAL LOW (ref 135–145)
Total Bilirubin: 0.7 mg/dL (ref 0.3–1.2)
Total Protein: 6.1 g/dL — ABNORMAL LOW (ref 6.5–8.1)

## 2021-10-06 LAB — CBC
HCT: 33.2 % — ABNORMAL LOW (ref 36.0–46.0)
Hemoglobin: 10.9 g/dL — ABNORMAL LOW (ref 12.0–15.0)
MCH: 29.5 pg (ref 26.0–34.0)
MCHC: 32.8 g/dL (ref 30.0–36.0)
MCV: 89.7 fL (ref 80.0–100.0)
Platelets: 228 10*3/uL (ref 150–400)
RBC: 3.7 MIL/uL — ABNORMAL LOW (ref 3.87–5.11)
RDW: 13.6 % (ref 11.5–15.5)
WBC: 8.4 10*3/uL (ref 4.0–10.5)
nRBC: 0 % (ref 0.0–0.2)

## 2021-10-06 LAB — RESP PANEL BY RT-PCR (FLU A&B, COVID) ARPGX2
Influenza A by PCR: POSITIVE — AB
Influenza B by PCR: NEGATIVE
SARS Coronavirus 2 by RT PCR: NEGATIVE

## 2021-10-06 LAB — URINALYSIS, ROUTINE W REFLEX MICROSCOPIC
Bilirubin Urine: NEGATIVE
Glucose, UA: NEGATIVE mg/dL
Hgb urine dipstick: NEGATIVE
Ketones, ur: NEGATIVE mg/dL
Leukocytes,Ua: NEGATIVE
Nitrite: NEGATIVE
Protein, ur: NEGATIVE mg/dL
Specific Gravity, Urine: 1.002 — ABNORMAL LOW (ref 1.005–1.030)
pH: 7 (ref 5.0–8.0)

## 2021-10-06 LAB — FETAL FIBRONECTIN: Fetal Fibronectin: NEGATIVE

## 2021-10-06 MED ORDER — DIPHENHYDRAMINE HCL 50 MG/ML IJ SOLN
25.0000 mg | Freq: Once | INTRAMUSCULAR | Status: AC
  Administered 2021-10-06: 25 mg via INTRAVENOUS
  Filled 2021-10-06: qty 1

## 2021-10-06 MED ORDER — BUTALBITAL-APAP-CAFFEINE 50-325-40 MG PO TABS
2.0000 | ORAL_TABLET | Freq: Once | ORAL | Status: AC
  Administered 2021-10-06: 2 via ORAL
  Filled 2021-10-06: qty 2

## 2021-10-06 MED ORDER — NIFEDIPINE ER OSMOTIC RELEASE 30 MG PO TB24
30.0000 mg | ORAL_TABLET | Freq: Every day | ORAL | Status: DC
  Administered 2021-10-06: 30 mg via ORAL
  Filled 2021-10-06: qty 1

## 2021-10-06 MED ORDER — BETAMETHASONE SOD PHOS & ACET 6 (3-3) MG/ML IJ SUSP
12.0000 mg | Freq: Once | INTRAMUSCULAR | Status: AC
  Administered 2021-10-06: 12 mg via INTRAMUSCULAR
  Filled 2021-10-06: qty 5

## 2021-10-06 MED ORDER — METOCLOPRAMIDE HCL 5 MG/ML IJ SOLN
10.0000 mg | Freq: Once | INTRAMUSCULAR | Status: AC
  Administered 2021-10-06: 10 mg via INTRAVENOUS
  Filled 2021-10-06: qty 2

## 2021-10-06 MED ORDER — TERBUTALINE SULFATE 1 MG/ML IJ SOLN
0.2500 mg | Freq: Once | INTRAMUSCULAR | Status: AC
  Administered 2021-10-06: 0.25 mg via SUBCUTANEOUS
  Filled 2021-10-06: qty 1

## 2021-10-06 MED ORDER — LACTATED RINGERS IV BOLUS
1000.0000 mL | Freq: Once | INTRAVENOUS | Status: AC
  Administered 2021-10-06: 1000 mL via INTRAVENOUS

## 2021-10-06 MED ORDER — OSELTAMIVIR PHOSPHATE 75 MG PO CAPS: 75.0000 mg | ORAL_CAPSULE | Freq: Two times a day (BID) | ORAL | 0 refills | Status: DC

## 2021-10-06 MED ORDER — ACETAMINOPHEN 325 MG PO TABS: 650.0000 mg | ORAL_TABLET | Freq: Once | ORAL | Status: DC

## 2021-10-06 NOTE — MAU Note (Signed)
Presents for BP evaluation and flu like symptoms.  States has epigastric pain and H/A, denies visual disturbances.  States H/A unrelieved with Tylenol.  Reports took BP @ home today, 142/92 and was elevated at office visit yesterday, 162/92 per pt.  States has had a fever, last night 101.2 and nasal congestion.  States mild nonproductive cough.   Endorses +FM, denies VB or LOF.

## 2021-10-06 NOTE — MAU Provider Note (Signed)
Patient Brandi Burns is a 35 y.o.  [phone] at [redacted]w[redacted]d  Here with complaints of headache, epigastric pain, muscle soreness, contractions, nausea and vomiting. She denies decreased fetal movements, blurry vision, floating spots, vaginal bleeding, LOF.   She was diagnosed with GHTN two months ago; it has "been getting higher". She had lab work yesterday which showed protein in her urine, but CBC and CMP was normal (per patient).   She reports that she threw up twice today and once yesterday.   Patient saw Dr. Elon Spanner in the officer yesterday for a routine appointment and weekly NST yesterday; she was given RX for Procardia for blood pressure control yesterday. She has tried labetalol in the past and "it about killed me". She did not start her procardia yet.   History     CSN: [id number]  Arrival date and time: 10/06/21 1439   None     Chief Complaint  Patient presents with   BP Evaluation   Fever   Headache   Fever  This is a new problem. The current episode started yesterday. The problem occurs constantly. Maximum temperature: 101.2 last night and then 100.5 today. The temperature was taken using a tympanic thermometer. Associated symptoms include headaches and vomiting. Pertinent negatives include no abdominal pain or coughing. She has tried acetaminophen for the symptoms.  Headache  This is a new problem. The current episode started 1 to 4 weeks ago. The problem occurs constantly. The problem has been unchanged. The pain is located in the Frontal region. The pain is at a severity of 7/10. Associated symptoms include a fever and vomiting. Pertinent negatives include no abdominal pain, coughing, dizziness or visual change. Exacerbated by: movement. Her past medical history is significant for hypertension.   OB History     Gravida  5   Para  3   Term  3   Preterm      AB  1   Living  3      SAB      IAB      Ectopic  1   Multiple  0   Live Births  3            Past Medical History:  Diagnosis Date   Abnormal Pap smear 2011   Anxiety    Depression    H/O thyroid nodule    MD just watching, no treatment/no meds   HPV in female    Seasonal allergies    SVD (spontaneous vaginal delivery)    x 3   Thyroid disease    Nodules- MD just watching, no treatments/no meds   Thyroid nodule    Vaginal Pap smear, abnormal     Past Surgical History:  Procedure Laterality Date   CHOLECYSTECTOMY  2011   COLPOSCOPY     DIAGNOSTIC LAPAROSCOPY WITH REMOVAL OF ECTOPIC PREGNANCY Bilateral 05/09/2018   Procedure: DIAGNOSTIC LAPAROSCOPY WITH REMOVAL OF ECTOPIC PREGNANCY;  Surgeon: Mitchel Honour, DO;  Location: [facility];  Service: Gynecology;  Laterality: Bilateral;  bialteral salpingectomy   HYSTEROSCOPY N/A 01/22/2021   Procedure: HYSTEROSCOPY;  Surgeon: [name], MD;  Location: [facility];  Service: Gynecology;  Laterality: N/A;   LAPAROSCOPIC BILATERAL SALPINGECTOMY Bilateral 05/09/2018   Procedure: LAPAROSCOPIC BILATERAL SALPINGECTOMY;  Surgeon: Mitchel Honour, DO;  Location: [facility];  Service: Gynecology;  Laterality: Bilateral;   LAPAROSCOPIC TUBAL LIGATION Bilateral 03/17/2018   Procedure: LAPAROSCOPIC TUBAL LIGATION WITH FILSHIE CLIPS;  Surgeon: [name], MD;  Location: [facility];  Service: Gynecology;  Laterality: Bilateral;   TUBAL LIGATION      Family History  Problem Relation Age of Onset   Heart disease Maternal Uncle    Heart attack Maternal Uncle 30   Emphysema Maternal Grandmother        Smoker   Thyroid disease Maternal Grandmother    Aneurysm Maternal Grandmother        AAA -- s/p 2 surgical procedures   Hyperlipidemia Maternal Grandmother    Cancer Maternal Grandmother        Skin   Heart disease Maternal Grandfather        5 bypass   Diabetes Maternal Grandfather    Hypertension Maternal Grandfather    Healthy Son    ADD / ADHD Son    Anemia Son    Asthma Son    Healthy Daughter     Social  History   Tobacco Use   Smoking status: Former    Packs/day: 0.50    Years: 10.00    Pack years: 5.00    Types: Cigarettes    Quit date: 09/23/2015    Years since quitting: 6.0   Smokeless tobacco: Never  Vaping Use   Vaping Use: Never used  Substance Use Topics   Alcohol use: Not Currently   Drug use: No    Allergies:  Allergies  Allergen Reactions   Wellbutrin [Bupropion Hcl] Hives    Medications Prior to Admission  Medication Sig Dispense Refill Last Dose   Prenatal Vit-Fe Fumarate-FA (PRENATAL MULTIVITAMIN) TABS tablet Take 1 tablet by mouth daily at 12 noon.       Review of Systems  Constitutional:  Positive for fever.  Respiratory:  Negative for cough.   Gastrointestinal:  Positive for vomiting. Negative for abdominal pain.  Neurological:  Positive for headaches. Negative for dizziness.  Physical Exam   Blood pressure 132/75, pulse (!) 113, temperature 99.2 F (37.3 C), temperature source Oral, resp. rate 18, height 5' 5.5" (1.664 m), weight 84.6 kg, last menstrual period 01/01/2021, SpO2 100 %.  Physical Exam Constitutional:      Appearance: She is well-developed.  HENT:     Head: Normocephalic.  Pulmonary:     Effort: Pulmonary effort is normal.  Abdominal:     Palpations: Abdomen is soft.  Musculoskeletal:        General: Normal range of motion.  Skin:    General: Skin is warm.  Neurological:     Mental Status: She is alert.    MAU Course  Procedures  MDM -NST: 150 bpm, mod var, present acel, no decels, irregular contractions  Patient to have COVID test and flu test>Flu test positive for Flu A -COVID negative  Patient had 2 tabs of fioricet and HA was not improved; gave benadryl and reglan IV and 2 bags of fluid and patient's HA is now a 2.  -FFN is negative  -CMP is normal  Cervix is unchanged in MAU; FT, long posterior; feels slightly softer at the end of MAU visit but is essentially unchanged after many hours of monitoring.  Patient  received terbutaline and BMZ  VSS while in MAU, no severe range pressures  Patient was given her 30 Xl of procardia today as she had not started taking it.    Assessment and Plan   1. Influenza A   -sent home with instructions  to start Tamiflu tonight; explained that HA was probably due to influenza, not pre-e especially since she has normal labs here.  -Dr.  Tomblin aware of patient's presentation, labs, BPs and he will schedule her for follow up BMZ tomorrow and nurse visit. He will leave a message for the front desk to schedule her.  -discussed that patient should take Tamiflu and tylenol, rest and hydrate -patient and cousin verbalized understanding; all questions answered.   Brandi Burns 10/06/2021, 3:34 PM

## 2021-10-06 NOTE — Discharge Instructions (Signed)
-  take tamiflu for 5 days -rest, hydrate, you can take Tylenol if necessary -Dr. Huel Coventry office will call you tomorrow to check on you and also to schedule your BMZ appt

## 2021-10-07 ENCOUNTER — Inpatient Hospital Stay (HOSPITAL_COMMUNITY)
Admission: AD | Admit: 2021-10-07 | Discharge: 2021-10-07 | Disposition: A | Discharge status: Home / Self Care | Payer: 59 | Attending: Obstetrics & Gynecology | Admitting: Obstetrics & Gynecology

## 2021-10-07 ENCOUNTER — Other Ambulatory Visit: Payer: Self-pay

## 2021-10-07 DIAGNOSIS — Z3A32 32 weeks gestation of pregnancy: Secondary | ICD-10-CM | POA: Diagnosis not present

## 2021-10-07 DIAGNOSIS — J101 Influenza due to other identified influenza virus with other respiratory manifestations: Secondary | ICD-10-CM | POA: Diagnosis not present

## 2021-10-07 DIAGNOSIS — Z3483 Encounter for supervision of other normal pregnancy, third trimester: Secondary | ICD-10-CM | POA: Insufficient documentation

## 2021-10-07 DIAGNOSIS — O26893 Other specified pregnancy related conditions, third trimester: Secondary | ICD-10-CM

## 2021-10-07 DIAGNOSIS — Z3A28 28 weeks gestation of pregnancy: Secondary | ICD-10-CM | POA: Insufficient documentation

## 2021-10-07 DIAGNOSIS — O4700 False labor before 37 completed weeks of gestation, unspecified trimester: Secondary | ICD-10-CM | POA: Diagnosis not present

## 2021-10-07 MED ORDER — BETAMETHASONE SOD PHOS & ACET 6 (3-3) MG/ML IJ SUSP
12.0000 mg | Freq: Once | INTRAMUSCULAR | Status: AC
  Administered 2021-10-07: 12 mg via INTRAMUSCULAR
  Filled 2021-10-07: qty 5

## 2021-10-07 NOTE — MAU Provider Note (Signed)
None     S [name] is a 35 y.o. [phone] patient who presents to MAU today for her second dose of betamethasone. She initially presented to the MAU yesterday with  headache and myalgias and found to have influenza. She was also having some contractions, on monitor were irregular yesterday, without cervical change, and therefore  was terbutaline and BMZ given yesterday (11/5). She returns today for her second dose of BMZ.  Initial plan per note yesterday was to have patient get BMZ dose #2 at [facility], however she did not get that is here now for her 2nd dose.  She reports she feels a lot better today and has no contractions. Also denies vaginal bleeding.   O BP 125/66 (BP Location: Right Arm)   Pulse (!) 103   Temp 97.7 F (36.5 C) (Oral)   Resp 20   Ht 5' 5.5" (1.664 m)   Wt 85 kg   LMP 01/01/2021   SpO2 99%   BMI 30.69 kg/m  Physical Exam Constitutional:      Appearance: Normal appearance.  HENT:     Head: Normocephalic.  Cardiovascular:     Rate and Rhythm: Regular rhythm.  Abdominal:     General: Abdomen is flat. There is no distension.  Musculoskeletal:        General: Normal range of motion.  Skin:    General: Skin is warm.     Capillary Refill: Capillary refill takes less than 2 seconds.  Neurological:     General: No focal deficit present.     Mental Status: She is alert and oriented to person, place, and time.    A Medical screening exam complete Preterm contractions at 33 weeks, improved. Here for dose#2 of BMZ. No signs of contractions of labor at this time.  P Give BMZ dose #2 Discharge from MAU in stable condition Patient has follow up appt with OB early next week Warning signs for worsening condition that would warrant emergency follow-up discussed Patient may return to MAU as needed    Warner Mccreedy, MD, MPH OB Fellow, [facility]

## 2021-10-07 NOTE — MAU Note (Signed)
Pt returned to MAU for second dose of BMZ. Pt denies LOF, VB, CTX, and DFM.  Pt denies pain.  FHR 150

## 2021-10-07 NOTE — Discharge Instructions (Signed)
You came to the MAU because you were due for dose# 2 of your steroid shot. You felt better when you came in, did not have contractions when you came in, and also had no bleeding and you got a your steroid shot.

## 2021-10-08 ENCOUNTER — Inpatient Hospital Stay (HOSPITAL_COMMUNITY)
Admission: AD | Admit: 2021-10-08 | Discharge: 2021-10-08 | Disposition: A | Discharge status: Home / Self Care | Payer: 59 | Attending: Obstetrics and Gynecology | Admitting: Obstetrics and Gynecology

## 2021-10-08 ENCOUNTER — Encounter (HOSPITAL_COMMUNITY): Payer: Self-pay | Admitting: Obstetrics and Gynecology

## 2021-10-08 DIAGNOSIS — O133 Gestational [pregnancy-induced] hypertension without significant proteinuria, third trimester: Secondary | ICD-10-CM | POA: Insufficient documentation

## 2021-10-08 DIAGNOSIS — O1493 Unspecified pre-eclampsia, third trimester: Secondary | ICD-10-CM | POA: Diagnosis not present

## 2021-10-08 DIAGNOSIS — O4703 False labor before 37 completed weeks of gestation, third trimester: Secondary | ICD-10-CM | POA: Diagnosis not present

## 2021-10-08 DIAGNOSIS — O4700 False labor before 37 completed weeks of gestation, unspecified trimester: Secondary | ICD-10-CM

## 2021-10-08 DIAGNOSIS — Z3A33 33 weeks gestation of pregnancy: Secondary | ICD-10-CM | POA: Diagnosis not present

## 2021-10-08 DIAGNOSIS — R519 Headache, unspecified: Secondary | ICD-10-CM | POA: Diagnosis present

## 2021-10-08 LAB — COMPREHENSIVE METABOLIC PANEL
ALT: 20 U/L (ref 0–44)
AST: 36 U/L (ref 15–41)
Albumin: 2.7 g/dL — ABNORMAL LOW (ref 3.5–5.0)
Alkaline Phosphatase: 141 U/L — ABNORMAL HIGH (ref 38–126)
Anion gap: 12 (ref 5–15)
BUN: <5 mg/dL — ABNORMAL LOW (ref 6–20)
CO2: 19 mmol/L — ABNORMAL LOW (ref 22–32)
Calcium: 8.5 mg/dL — ABNORMAL LOW (ref 8.9–10.3)
Chloride: 104 mmol/L (ref 98–111)
Creatinine, Ser: 0.61 mg/dL (ref 0.44–1.00)
GFR, Estimated: >60 mL/min (ref >60)
Glucose, Bld: 121 mg/dL — ABNORMAL HIGH (ref 70–99)
Potassium: 3.1 mmol/L — ABNORMAL LOW (ref 3.5–5.1)
Sodium: 135 mmol/L (ref 135–145)
Total Bilirubin: 0.5 mg/dL (ref 0.3–1.2)
Total Protein: 6 g/dL — ABNORMAL LOW (ref 6.5–8.1)

## 2021-10-08 LAB — URINALYSIS, ROUTINE W REFLEX MICROSCOPIC
Bilirubin Urine: NEGATIVE
Glucose, UA: NEGATIVE mg/dL
Hgb urine dipstick: NEGATIVE
Ketones, ur: 5 mg/dL — AB
Leukocytes,Ua: NEGATIVE
Nitrite: NEGATIVE
Protein, ur: 30 mg/dL — AB
Specific Gravity, Urine: 1.009 (ref 1.005–1.030)
pH: 6 (ref 5.0–8.0)

## 2021-10-08 LAB — PROTEIN / CREATININE RATIO, URINE
Creatinine, Urine: 61.09 mg/dL
Protein Creatinine Ratio: 0.69 mg/mg{Cre} — ABNORMAL HIGH (ref 0.00–0.15)
Total Protein, Urine: 42 mg/dL

## 2021-10-08 LAB — CBC
HCT: 32.4 % — ABNORMAL LOW (ref 36.0–46.0)
Hemoglobin: 10.8 g/dL — ABNORMAL LOW (ref 12.0–15.0)
MCH: 29.8 pg (ref 26.0–34.0)
MCHC: 33.3 g/dL (ref 30.0–36.0)
MCV: 89.3 fL (ref 80.0–100.0)
Platelets: 268 10*3/uL (ref 150–400)
RBC: 3.63 MIL/uL — ABNORMAL LOW (ref 3.87–5.11)
RDW: 13.8 % (ref 11.5–15.5)
WBC: 7.3 10*3/uL (ref 4.0–10.5)
nRBC: 0.4 % — ABNORMAL HIGH (ref 0.0–0.2)

## 2021-10-08 MED ORDER — BUTALBITAL-APAP-CAFFEINE 50-325-40 MG PO TABS: 1.0000 | ORAL_TABLET | Freq: Once | ORAL | Status: DC

## 2021-10-08 MED ORDER — BUTALBITAL-APAP-CAFFEINE 50-325-40 MG PO TABS
2.0000 | ORAL_TABLET | Freq: Once | ORAL | Status: AC
  Administered 2021-10-08: 20:00:00 2 via ORAL
  Filled 2021-10-08: qty 2

## 2021-10-08 MED ORDER — BUTALBITAL-APAP-CAFFEINE 50-325-40 MG PO TABS
2.0000 | ORAL_TABLET | Freq: Once | ORAL | Status: DC
  Filled 2021-10-08: qty 2

## 2021-10-08 NOTE — MAU Note (Signed)
Brandi Burns is a 35 y.o. at [redacted]w[redacted]d here in MAU reporting: headache started this AM, states tylenol helps ease off headache but then it comes back once the tylenol wears off. Since 1400 has had epigastric pain and irregular contractions and pelvic pressure. BP at home was 152/82. DFM.  Onset of complaint: ongoing  Pain score: headache 5/10, epigastric 8/10  Vitals:   10/08/21 1804  BP: 120/68  Pulse: 85  Resp: 16  Temp: 98.4 F (36.9 C)  SpO2: 99%     FHT:135  Lab orders placed from triage: UA

## 2021-10-08 NOTE — MAU Provider Note (Addendum)
History     CSN: [id number]  Arrival date and time: 10/08/21 1746   Event Date/Time   First Provider Initiated Contact with Patient 10/08/21 1855      Chief Complaint  Patient presents with   Hypertension   Headache   Abdominal Pain   Decreased Fetal Movement   HPI  Brandi Burns. Is a 35 y.o. female [phone] @ [redacted]w[redacted]d  return to MAU with epigastric pain, headache and elevated BP. Her family member who is present with her reports the automatic cuff at home is not working. Reports she checked bilateral arms with manual cuffs and both arms showed systolic of 150's. She reports bilateral temple HA. She took tylenol this afternoon which did not help her HA completely.  She reports her HA as 4/10.   She developed GHTN @ 27 weeks and was started on procardia 30 XL. Her last dose was this morning. She was seen in MAU on 11/15 and was given BMZ because her cervix went from closed to 0.5 cm. She continues to have irregular contractions. She completed her course of BMZ.   HX of cholecystectomy 10 years ago.    OB History     Gravida  5   Para  3   Term  3   Preterm      AB  1   Living  3      SAB      IAB      Ectopic  1   Multiple  0   Live Births  3           Past Medical History:  Diagnosis Date   Abnormal Pap smear 2011   Anxiety    Depression    H/O thyroid nodule    MD just watching, no treatment/no meds   HPV in female    Seasonal allergies    SVD (spontaneous vaginal delivery)    x 3   Thyroid disease    Nodules- MD just watching, no treatments/no meds   Thyroid nodule    Vaginal Pap smear, abnormal     Past Surgical History:  Procedure Laterality Date   CHOLECYSTECTOMY  2011   COLPOSCOPY     DIAGNOSTIC LAPAROSCOPY WITH REMOVAL OF ECTOPIC PREGNANCY Bilateral 05/09/2018   Procedure: DIAGNOSTIC LAPAROSCOPY WITH REMOVAL OF ECTOPIC PREGNANCY;  Surgeon: Mitchel Honour, DO;  Location: [facility];  Service: Gynecology;  Laterality: Bilateral;   bialteral salpingectomy   HYSTEROSCOPY N/A 01/22/2021   Procedure: HYSTEROSCOPY;  Surgeon: [name], MD;  Location: [facility];  Service: Gynecology;  Laterality: N/A;   LAPAROSCOPIC BILATERAL SALPINGECTOMY Bilateral 05/09/2018   Procedure: LAPAROSCOPIC BILATERAL SALPINGECTOMY;  Surgeon: Mitchel Honour, DO;  Location: [facility];  Service: Gynecology;  Laterality: Bilateral;   LAPAROSCOPIC TUBAL LIGATION Bilateral 03/17/2018   Procedure: LAPAROSCOPIC TUBAL LIGATION WITH FILSHIE CLIPS;  Surgeon: [name], MD;  Location: [facility];  Service: Gynecology;  Laterality: Bilateral;   TUBAL LIGATION      Family History  Problem Relation Age of Onset   Heart disease Maternal Uncle    Heart attack Maternal Uncle 30   Emphysema Maternal Grandmother        Smoker   Thyroid disease Maternal Grandmother    Aneurysm Maternal Grandmother        AAA -- s/p 2 surgical procedures   Hyperlipidemia Maternal Grandmother    Cancer Maternal Grandmother        Skin   Heart disease Maternal Grandfather  5 bypass   Diabetes Maternal Grandfather    Hypertension Maternal Grandfather    Healthy Son    ADD / ADHD Son    Anemia Son    Asthma Son    Healthy Daughter     Social History   Tobacco Use   Smoking status: Former    Packs/day: 0.50    Years: 10.00    Pack years: 5.00    Types: Cigarettes    Quit date: 09/23/2015    Years since quitting: 6.0   Smokeless tobacco: Never  Vaping Use   Vaping Use: Never used  Substance Use Topics   Alcohol use: Not Currently   Drug use: No    Allergies:  Allergies  Allergen Reactions   Wellbutrin [Bupropion Hcl] Hives    Medications Prior to Admission  Medication Sig Dispense Refill Last Dose   acetaminophen (TYLENOL) 500 MG tablet Take 500 mg by mouth every 6 (six) hours as needed.   10/08/2021 at 1615   NIFEdipine (PROCARDIA-XL/NIFEDICAL-XL) 30 MG 24 hr tablet Take 30 mg by mouth daily.   10/08/2021 at 0800    oseltamivir (TAMIFLU) 75 MG capsule Take 1 capsule (75 mg total) by mouth every 12 (twelve) hours. 10 capsule 0 10/08/2021 at 0800   Prenatal Vit-Fe Fumarate-FA (PRENATAL MULTIVITAMIN) TABS tablet Take 1 tablet by mouth daily at 12 noon.   10/08/2021   Results for orders placed or performed during the hospital encounter of 10/08/21 (from the past 48 hour(s))  Urinalysis, Routine w reflex microscopic Urine, Clean Catch     Status: Abnormal   Collection Time: 10/08/21  5:59 PM  Result Value Ref Range   Color, Urine YELLOW YELLOW   APPearance HAZY (A) CLEAR   Specific Gravity, Urine 1.009 1.005 - 1.030   pH 6.0 5.0 - 8.0   Glucose, UA NEGATIVE NEGATIVE mg/dL   Hgb urine dipstick NEGATIVE NEGATIVE   Bilirubin Urine NEGATIVE NEGATIVE   Ketones, ur 5 (A) NEGATIVE mg/dL   Protein, ur 30 (A) NEGATIVE mg/dL   Nitrite NEGATIVE NEGATIVE   Leukocytes,Ua NEGATIVE NEGATIVE   RBC / HPF 0-5 0 - 5 RBC/hpf   WBC, UA 0-5 0 - 5 WBC/hpf   Bacteria, UA RARE (A) NONE SEEN   Squamous Epithelial / LPF 0-5 0 - 5   Mucus PRESENT     Comment: Performed at [facility], [address], [place], [place] [address]  CBC     Status: Abnormal   Collection Time: 10/08/21  6:37 PM  Result Value Ref Range   WBC 7.3 4.0 - 10.5 K/uL   RBC 3.63 (L) 3.87 - 5.11 MIL/uL   Hemoglobin 10.8 (L) 12.0 - 15.0 g/dL   HCT 99.8 (L) 33.8 - 25.0 %   MCV 89.3 80.0 - 100.0 fL   MCH 29.8 26.0 - 34.0 pg   MCHC 33.3 30.0 - 36.0 g/dL   RDW 53.9 76.7 - 34.1 %   Platelets 268 150 - 400 K/uL   nRBC 0.4 (H) 0.0 - 0.2 %    Comment: Performed at [facility], [address] [place], [place] [address]     Review of Systems  Neurological:  Positive for headaches.  Physical Exam   Blood pressure 127/79, pulse 99, temperature 98.4 F (36.9 C), temperature source Oral, resp. rate 16, height 5' 5.5" (1.664 m), weight 84.1 kg, last menstrual period 01/01/2021, SpO2 99 %.  Patient Vitals for the past 24 hrs:  BP Temp Temp  src  Pulse Resp SpO2 Height Weight  10/08/21 1931 130/80 -- -- -- -- -- -- --  10/08/21 1930 130/80 -- -- -- -- -- -- --  10/08/21 1907 127/79 -- -- 99 -- 99 % -- --  10/08/21 1821 132/71 -- -- 93 -- 100 % -- --  10/08/21 1804 120/68 98.4 F (36.9 C) Oral 85 16 99 % -- --  10/08/21 1759 -- -- -- -- -- -- 5' 5.5" (1.664 m) 84.1 kg    Physical Exam Vitals reviewed.  Constitutional:      General: She is not in acute distress.    Appearance: She is well-developed. She is not ill-appearing, toxic-appearing or diaphoretic.  HENT:     Head: Normocephalic.  Eyes:     Pupils: Pupils are equal, round, and reactive to light.  Abdominal:     Palpations: Abdomen is soft.     Tenderness: There is no abdominal tenderness. Negative signs include McBurney's sign.  Genitourinary:    Comments: Cervix is 0.5 cm, thick, posterior. Exam by Venia Carbon NP  Neurological:     Mental Status: She is alert and oriented to person, place, and time.     GCS: GCS eye subscore is 4. GCS verbal subscore is 5. GCS motor subscore is 6.     Deep Tendon Reflexes: Reflexes normal (Negative clonus).  Psychiatric:        Behavior: Behavior normal.   Fetal Tracing: Baseline: 125 bpm Variability: Moderate  Accelerations: 15x15 Decelerations: None Toco: Irregular pattern   MAU Course  Procedures None  MDM  Manual BP readings in both arms 130/80 Fioricet given 2 tablets Oral PIH labs collected. Cervix is unchanged from previous exam Considered a dose of procardia, however patient reports HA. Oral hydration ordered Report given to Wynelle Bourgeois CNM who resumes care of the patient.   Duane Lope, NP 10/08/2021 8:09 PM  Given Fioricet for headache with did give relief. Results for orders placed or performed during the hospital encounter of 10/08/21 (from the past 24 hour(s))  Urinalysis, Routine w reflex microscopic Urine, Clean Catch     Status: Abnormal   Collection Time: 10/08/21  5:59 PM  Result  Value Ref Range   Color, Urine YELLOW YELLOW   APPearance HAZY (A) CLEAR   Specific Gravity, Urine 1.009 1.005 - 1.030   pH 6.0 5.0 - 8.0   Glucose, UA NEGATIVE NEGATIVE mg/dL   Hgb urine dipstick NEGATIVE NEGATIVE   Bilirubin Urine NEGATIVE NEGATIVE   Ketones, ur 5 (A) NEGATIVE mg/dL   Protein, ur 30 (A) NEGATIVE mg/dL   Nitrite NEGATIVE NEGATIVE   Leukocytes,Ua NEGATIVE NEGATIVE   RBC / HPF 0-5 0 - 5 RBC/hpf   WBC, UA 0-5 0 - 5 WBC/hpf   Bacteria, UA RARE (A) NONE SEEN   Squamous Epithelial / LPF 0-5 0 - 5   Mucus PRESENT   Protein / creatinine ratio, urine     Status: Abnormal   Collection Time: 10/08/21  6:25 PM  Result Value Ref Range   Creatinine, Urine 61.09 mg/dL   Total Protein, Urine 42 mg/dL   Protein Creatinine Ratio 0.69 (H) 0.00 - 0.15 mg/mg[Cre]  CBC     Status: Abnormal   Collection Time: 10/08/21  6:37 PM  Result Value Ref Range   WBC 7.3 4.0 - 10.5 K/uL   RBC 3.63 (L) 3.87 - 5.11 MIL/uL   Hemoglobin 10.8 (L) 12.0 - 15.0 g/dL   HCT 16.6 (L) 06.3 - 01.6 %  MCV 89.3 80.0 - 100.0 fL   MCH 29.8 26.0 - 34.0 pg   MCHC 33.3 30.0 - 36.0 g/dL   RDW 91.6 38.4 - 66.5 %   Platelets 268 150 - 400 K/uL   nRBC 0.4 (H) 0.0 - 0.2 %  Comprehensive metabolic panel     Status: Abnormal   Collection Time: 10/08/21  6:37 PM  Result Value Ref Range   Sodium 135 135 - 145 mmol/L   Potassium 3.1 (L) 3.5 - 5.1 mmol/L   Chloride 104 98 - 111 mmol/L   CO2 19 (L) 22 - 32 mmol/L   Glucose, Bld 121 (H) 70 - 99 mg/dL   BUN <5 (L) 6 - 20 mg/dL   Creatinine, Ser 9.93 0.44 - 1.00 mg/dL   Calcium 8.5 (L) 8.9 - 10.3 mg/dL   Total Protein 6.0 (L) 6.5 - 8.1 g/dL   Albumin 2.7 (L) 3.5 - 5.0 g/dL   AST 36 15 - 41 U/L   ALT 20 0 - 44 U/L   Alkaline Phosphatase 141 (H) 38 - 126 U/L   Total Bilirubin 0.5 0.3 - 1.2 mg/dL   GFR, Estimated >57 >01 mL/min   Anion gap 12 5 - 15   Discussed findings.with Dr Lorane Gell who recommends followup with Dr Elon Spanner Discussed findings with pt and her  cousin Labs normal except PCR which has risen from 0.39 to 0.69 Patient has followup in office, will call office in AM to assure closer followup  Contractions lessened with PO hydration, no change in cervix and FFn was negative yesterday  Assessment and Plan  A:  [facility] IUP at [redacted]w[redacted]d       Preterm uterine contractions       Preeclampsia  P   Discharge home       Preeclampsia precautions       Preterm labor precautions       Pt to followup in office this week Encouraged to return if she develops worsening of symptoms, increase in pain, fever, or other concerning symptoms.   Aviva Signs, CNM

## 2021-10-08 NOTE — MAU Provider Note (Signed)
History     CSN: [id number]  Arrival date and time: 10/08/21 1746   None     Chief Complaint  Patient presents with   Hypertension   Headache   Abdominal Pain   Decreased Fetal Movement   HPI 35 yo F [id number] at [redacted]w[redacted]d with known influenza who presents with headaches, epigastric pain, and contractions with pelvic pressure and DFM  #Influenza - headaches that improve with tylenol - given Tamiflu on 10/06/2021  #Headaches #epigastric pain   #Pelvic pressure Was seen on 11/15 for contractions Cervical exam at that time  Was given BMZ x 2 (11/15 and 11/16) Was here yesterday for BMZ dose#2 At that time did not have contractions or pelvic pressure   OB History     Gravida  5   Para  3   Term  3   Preterm      AB  1   Living  3      SAB      IAB      Ectopic  1   Multiple  0   Live Births  3           Past Medical History:  Diagnosis Date   Abnormal Pap smear 2011   Anxiety    Depression    H/O thyroid nodule    MD just watching, no treatment/no meds   HPV in female    Seasonal allergies    SVD (spontaneous vaginal delivery)    x 3   Thyroid disease    Nodules- MD just watching, no treatments/no meds   Thyroid nodule    Vaginal Pap smear, abnormal     Past Surgical History:  Procedure Laterality Date   CHOLECYSTECTOMY  2011   COLPOSCOPY     DIAGNOSTIC LAPAROSCOPY WITH REMOVAL OF ECTOPIC PREGNANCY Bilateral 05/09/2018   Procedure: DIAGNOSTIC LAPAROSCOPY WITH REMOVAL OF ECTOPIC PREGNANCY;  Surgeon: Mitchel Honour, DO;  Location: [facility];  Service: Gynecology;  Laterality: Bilateral;  bialteral salpingectomy   HYSTEROSCOPY N/A 01/22/2021   Procedure: HYSTEROSCOPY;  Surgeon: [name], MD;  Location: [facility];  Service: Gynecology;  Laterality: N/A;   LAPAROSCOPIC BILATERAL SALPINGECTOMY Bilateral 05/09/2018   Procedure: LAPAROSCOPIC BILATERAL SALPINGECTOMY;  Surgeon: Mitchel Honour, DO;  Location: [facility];   Service: Gynecology;  Laterality: Bilateral;   LAPAROSCOPIC TUBAL LIGATION Bilateral 03/17/2018   Procedure: LAPAROSCOPIC TUBAL LIGATION WITH FILSHIE CLIPS;  Surgeon: [name], MD;  Location: [facility];  Service: Gynecology;  Laterality: Bilateral;   TUBAL LIGATION      Family History  Problem Relation Age of Onset   Heart disease Maternal Uncle    Heart attack Maternal Uncle 30   Emphysema Maternal Grandmother        Smoker   Thyroid disease Maternal Grandmother    Aneurysm Maternal Grandmother        AAA -- s/p 2 surgical procedures   Hyperlipidemia Maternal Grandmother    Cancer Maternal Grandmother        Skin   Heart disease Maternal Grandfather        5 bypass   Diabetes Maternal Grandfather    Hypertension Maternal Grandfather    Healthy Son    ADD / ADHD Son    Anemia Son    Asthma Son    Healthy Daughter     Social History   Tobacco Use   Smoking status: Former    Packs/day: 0.50    Years: 10.00  Pack years: 5.00    Types: Cigarettes    Quit date: 09/23/2015    Years since quitting: 6.0   Smokeless tobacco: Never  Vaping Use   Vaping Use: Never used  Substance Use Topics   Alcohol use: Not Currently   Drug use: No    Allergies:  Allergies  Allergen Reactions   Wellbutrin [Bupropion Hcl] Hives    Medications Prior to Admission  Medication Sig Dispense Refill Last Dose   acetaminophen (TYLENOL) 500 MG tablet Take 500 mg by mouth every 6 (six) hours as needed.   10/08/2021 at 1615   NIFEdipine (PROCARDIA-XL/NIFEDICAL-XL) 30 MG 24 hr tablet Take 30 mg by mouth daily.   10/08/2021 at 0800   oseltamivir (TAMIFLU) 75 MG capsule Take 1 capsule (75 mg total) by mouth every 12 (twelve) hours. 10 capsule 0 10/08/2021 at 0800   Prenatal Vit-Fe Fumarate-FA (PRENATAL MULTIVITAMIN) TABS tablet Take 1 tablet by mouth daily at 12 noon.   10/08/2021    Review of Systems Physical Exam   Blood pressure 120/68, pulse 85, temperature 98.4 F (36.9 C),  temperature source Oral, resp. rate 16, height 5' 5.5" (1.664 m), weight 84.1 kg, last menstrual period 01/01/2021, SpO2 99 %.  Physical Exam  MAU Course  Procedures  MDM   Assessment and Plan   ##Influenza - headaches that improve with tylenol - given Tamiflu on 10/06/2021  #Headaches #epigastric pain  #Pelvic pressure   #DFM Reports decreased fetal movement today  Brandi Burns 10/08/2021, 6:24 PM

## 2021-10-20 ENCOUNTER — Inpatient Hospital Stay (HOSPITAL_COMMUNITY): Admission: AD | Admit: 2021-10-20 | Payer: 59 | Source: Home / Self Care | Admitting: Obstetrics and Gynecology

## 2021-10-20 ENCOUNTER — Observation Stay (HOSPITAL_COMMUNITY)
Admission: AD | Admit: 2021-10-20 | Discharge: 2021-10-21 | Disposition: A | Discharge status: Home / Self Care | Payer: 59 | Attending: Obstetrics and Gynecology | Admitting: Obstetrics and Gynecology

## 2021-10-20 ENCOUNTER — Other Ambulatory Visit: Payer: Self-pay

## 2021-10-20 ENCOUNTER — Encounter (HOSPITAL_COMMUNITY): Payer: Self-pay | Admitting: Obstetrics and Gynecology

## 2021-10-20 DIAGNOSIS — Z7982 Long term (current) use of aspirin: Secondary | ICD-10-CM | POA: Diagnosis not present

## 2021-10-20 DIAGNOSIS — Z3689 Encounter for other specified antenatal screening: Secondary | ICD-10-CM

## 2021-10-20 DIAGNOSIS — O26893 Other specified pregnancy related conditions, third trimester: Secondary | ICD-10-CM

## 2021-10-20 DIAGNOSIS — R519 Headache, unspecified: Secondary | ICD-10-CM

## 2021-10-20 DIAGNOSIS — Z3A34 34 weeks gestation of pregnancy: Secondary | ICD-10-CM | POA: Diagnosis not present

## 2021-10-20 DIAGNOSIS — O1493 Unspecified pre-eclampsia, third trimester: Secondary | ICD-10-CM | POA: Diagnosis not present

## 2021-10-20 DIAGNOSIS — O1490 Unspecified pre-eclampsia, unspecified trimester: Secondary | ICD-10-CM | POA: Diagnosis present

## 2021-10-20 DIAGNOSIS — O1403 Mild to moderate pre-eclampsia, third trimester: Secondary | ICD-10-CM | POA: Diagnosis not present

## 2021-10-20 DIAGNOSIS — Z20822 Contact with and (suspected) exposure to covid-19: Secondary | ICD-10-CM | POA: Insufficient documentation

## 2021-10-20 LAB — CBC WITH DIFFERENTIAL/PLATELET
Abs Immature Granulocytes: 0.08 10*3/uL — ABNORMAL HIGH (ref 0.00–0.07)
Basophils Absolute: 0 10*3/uL (ref 0.0–0.1)
Basophils Relative: 0 %
Eosinophils Absolute: 0 10*3/uL (ref 0.0–0.5)
Eosinophils Relative: 0 %
HCT: 33.3 % — ABNORMAL LOW (ref 36.0–46.0)
Hemoglobin: 11.1 g/dL — ABNORMAL LOW (ref 12.0–15.0)
Immature Granulocytes: 1 %
Lymphocytes Relative: 17 %
Lymphs Abs: 1.5 10*3/uL (ref 0.7–4.0)
MCH: 29.2 pg (ref 26.0–34.0)
MCHC: 33.3 g/dL (ref 30.0–36.0)
MCV: 87.6 fL (ref 80.0–100.0)
Monocytes Absolute: 0.8 10*3/uL (ref 0.1–1.0)
Monocytes Relative: 9 %
Neutro Abs: 6.7 10*3/uL (ref 1.7–7.7)
Neutrophils Relative %: 73 %
Platelets: 252 10*3/uL (ref 150–400)
RBC: 3.8 MIL/uL — ABNORMAL LOW (ref 3.87–5.11)
RDW: 13.8 % (ref 11.5–15.5)
WBC: 9.1 10*3/uL (ref 4.0–10.5)
nRBC: 0 % (ref 0.0–0.2)

## 2021-10-20 LAB — COMPREHENSIVE METABOLIC PANEL
ALT: 13 U/L (ref 0–44)
AST: 15 U/L (ref 15–41)
Albumin: 2.8 g/dL — ABNORMAL LOW (ref 3.5–5.0)
Alkaline Phosphatase: 176 U/L — ABNORMAL HIGH (ref 38–126)
Anion gap: 9 (ref 5–15)
BUN: <5 mg/dL — ABNORMAL LOW (ref 6–20)
CO2: 21 mmol/L — ABNORMAL LOW (ref 22–32)
Calcium: 8.9 mg/dL (ref 8.9–10.3)
Chloride: 105 mmol/L (ref 98–111)
Creatinine, Ser: 0.66 mg/dL (ref 0.44–1.00)
GFR, Estimated: >60 mL/min (ref >60)
Glucose, Bld: 95 mg/dL (ref 70–99)
Potassium: 3.5 mmol/L (ref 3.5–5.1)
Sodium: 135 mmol/L (ref 135–145)
Total Bilirubin: 0.5 mg/dL (ref 0.3–1.2)
Total Protein: 6.2 g/dL — ABNORMAL LOW (ref 6.5–8.1)

## 2021-10-20 LAB — RESP PANEL BY RT-PCR (FLU A&B, COVID) ARPGX2
Influenza A by PCR: NEGATIVE
Influenza B by PCR: NEGATIVE
SARS Coronavirus 2 by RT PCR: NEGATIVE

## 2021-10-20 LAB — OB RESULTS CONSOLE GBS: GBS: NEGATIVE

## 2021-10-20 LAB — PROTEIN / CREATININE RATIO, URINE
Creatinine, Urine: 140.47 mg/dL
Protein Creatinine Ratio: 0.56 mg/mg{Cre} — ABNORMAL HIGH (ref 0.00–0.15)
Total Protein, Urine: 79 mg/dL

## 2021-10-20 MED ORDER — METOCLOPRAMIDE HCL 10 MG PO TABS
10.0000 mg | ORAL_TABLET | Freq: Once | ORAL | Status: AC
  Administered 2021-10-20: 10 mg via ORAL

## 2021-10-20 MED ORDER — ACETAMINOPHEN 325 MG PO TABS
650.0000 mg | ORAL_TABLET | Freq: Once | ORAL | Status: AC
  Administered 2021-10-20: 650 mg via ORAL

## 2021-10-20 MED ORDER — BUTALBITAL-APAP-CAFFEINE 50-325-40 MG PO TABS
ORAL_TABLET | ORAL | Status: AC
  Filled 2021-10-20: qty 1

## 2021-10-20 MED ORDER — BUTALBITAL-APAP-CAFFEINE 50-325-40 MG PO TABS
1.0000 | ORAL_TABLET | Freq: Four times a day (QID) | ORAL | Status: DC | PRN
  Administered 2021-10-20 – 2021-10-21 (×2): 1 via ORAL
  Filled 2021-10-20: qty 1

## 2021-10-20 MED ORDER — ZOLPIDEM TARTRATE 5 MG PO TABS: 5.0000 mg | ORAL_TABLET | Freq: Once | ORAL | Status: DC

## 2021-10-20 MED ORDER — CYCLOBENZAPRINE HCL 5 MG PO TABS: 10.0000 mg | ORAL_TABLET | Freq: Once | ORAL | Status: DC

## 2021-10-20 MED ORDER — CYCLOBENZAPRINE HCL 10 MG PO TABS
10.0000 mg | ORAL_TABLET | Freq: Once | ORAL | Status: AC
  Administered 2021-10-20: 10 mg via ORAL

## 2021-10-20 MED ORDER — LACTATED RINGERS IV BOLUS
500.0000 mL | Freq: Once | INTRAVENOUS | Status: AC
  Administered 2021-10-20: 500 mL via INTRAVENOUS

## 2021-10-20 MED ORDER — LACTATED RINGERS IV SOLN: INTRAVENOUS | Status: DC

## 2021-10-20 NOTE — H&P (Signed)
Brandi Burns is a 35 year old G 5 P 3 at 65 w 6 days admitted for observation.  She is a gestational carrier.  BP elevated in office and has been on BP meds.  BP today in office elevated and complained of mild headache. Sent to MAU, BPs and labs look fine but still reported a mild headache. Admitted for observation for mild preeclampsia OB History     Gravida  5   Para  3   Term  3   Preterm      AB  1   Living  3      SAB      IAB      Ectopic  1   Multiple  0   Live Births  3          Past Medical History:  Diagnosis Date   Abnormal Pap smear 2011   Anxiety    Depression    H/O thyroid nodule    MD just watching, no treatment/no meds   HPV in female    Seasonal allergies    SVD (spontaneous vaginal delivery)    x 3   Thyroid disease    Nodules- MD just watching, no treatments/no meds   Thyroid nodule    Vaginal Pap smear, abnormal    Past Surgical History:  Procedure Laterality Date   CHOLECYSTECTOMY  2011   COLPOSCOPY     DIAGNOSTIC LAPAROSCOPY WITH REMOVAL OF ECTOPIC PREGNANCY Bilateral 05/09/2018   Procedure: DIAGNOSTIC LAPAROSCOPY WITH REMOVAL OF ECTOPIC PREGNANCY;  Surgeon: Mitchel Honour, DO;  Location: [facility];  Service: Gynecology;  Laterality: Bilateral;  bialteral salpingectomy   HYSTEROSCOPY N/A 01/22/2021   Procedure: HYSTEROSCOPY;  Surgeon: [name], MD;  Location: [facility];  Service: Gynecology;  Laterality: N/A;   LAPAROSCOPIC BILATERAL SALPINGECTOMY Bilateral 05/09/2018   Procedure: LAPAROSCOPIC BILATERAL SALPINGECTOMY;  Surgeon: Mitchel Honour, DO;  Location: [facility];  Service: Gynecology;  Laterality: Bilateral;   LAPAROSCOPIC TUBAL LIGATION Bilateral 03/17/2018   Procedure: LAPAROSCOPIC TUBAL LIGATION WITH FILSHIE CLIPS;  Surgeon: [name], MD;  Location: [facility];  Service: Gynecology;  Laterality: Bilateral;   TUBAL LIGATION     Family History: family history includes ADD / ADHD in her son;  Anemia in her son; Aneurysm in her maternal grandmother; Asthma in her son; Cancer in her maternal grandmother; Diabetes in her maternal grandfather; Emphysema in her maternal grandmother; Healthy in her daughter and son; Heart attack (age of onset: 49) in her maternal uncle; Heart disease in her maternal grandfather and maternal uncle; Hyperlipidemia in her maternal grandmother; Hypertension in her maternal grandfather; Thyroid disease in her maternal grandmother. Social History:  reports that she quit smoking about 6 years ago. Her smoking use included cigarettes. She has a 5.00 pack-year smoking history. She has never used smokeless tobacco. She reports that she does not currently use alcohol. She reports that she does not use drugs.     Maternal Diabetes: No Genetic Screening: Normal Maternal Ultrasounds/Referrals: Normal Fetal Ultrasounds or other Referrals:  None Maternal Substance Abuse:  No Significant Maternal Medications:  None Significant Maternal Lab Results:  None Other Comments:  None  Review of Systems  All other systems reviewed and are negative. History Dilation: 1.5 Exam by:: Donia Ast, NP Blood pressure 125/84, pulse 91, temperature 98.3 F (36.8 C), temperature source Oral, resp. rate 18, height 5\' 6"  (1.676 m), weight 83.9 kg, last menstrual period 01/01/2021, SpO2 99 %.   Fetal  Exam Fetal State Assessment: Category I - tracings are normal.  Physical Exam Vitals and nursing note reviewed.  Constitutional:      Appearance: She is well-developed.  Cardiovascular:     Rate and Rhythm: Normal rate and regular rhythm.  Neurological:     Mental Status: She is alert.    Prenatal labs: ABO, Rh: --/--/O POS (03/01 1017) Antibody: NEG (03/01 0859) Rubella:   RPR:    HBsAg:    HIV:    GBS:     Prior to Admission medications   Medication Sig Start Date End Date Taking? Authorizing Provider  acetaminophen (TYLENOL) 500 MG tablet Take 500 mg by mouth every 6  (six) hours as needed.   Yes [provider]  aspirin EC 81 MG tablet Take 81 mg by mouth daily. Swallow whole.   Yes [provider]  NIFEdipine (PROCARDIA-XL/NIFEDICAL-XL) 30 MG 24 hr tablet Take 30 mg by mouth daily.   Yes [provider]  Prenatal Vit-Fe Fumarate-FA (PRENATAL MULTIVITAMIN) TABS tablet Take 1 tablet by mouth daily at 12 noon.   Yes [provider]  sertraline (ZOLOFT) 50 MG tablet Take 50 mg by mouth daily.   Yes [provider]  oseltamivir (TAMIFLU) 75 MG capsule Take 1 capsule (75 mg total) by mouth every 12 (twelve) hours. 10/06/21   Marylene Land, CNM    Assessment/Plan: IUP at 34 w 6 days Mild preeclampsia Admit for observation No indication for delivery at this time Treat mild headache prn Repeat labs in the morning.  Jeani Hawking 10/20/2021, 8:46 PM

## 2021-10-20 NOTE — Progress Notes (Signed)
Dr. Vincente Poli notified of patient's contractions and pain rating of 7/10. New orders received for IV fluids with 500cc bolus. Will continue to monitor. Carmelina Dane, RN

## 2021-10-20 NOTE — MAU Provider Note (Signed)
History     CSN: [id number]  Arrival date and time: 10/20/21 1202   Event Date/Time   First Provider Initiated Contact with Patient 10/20/21 1242      Chief Complaint  Patient presents with   Headache   Hypertension   [name] is a 35 y.o. [phone] at [redacted]w[redacted]d who presents to MAU for preeclampsia evaluation after she was seen in the office today for a routine visit and found to have elevated pressures in the office of 150s/100s and 150s/80s. Patient also endorses a 7/10 headache on arrival to MAU. Patient states that she took Tylenol 1000mg   around 9AM this morning that temporarily reduced the pain, but did not relieve it. Patient has a known diagnosis of preeclampsia and was placed on Procardia when she was diagnosed around 10/05/2021.  Pt denies blurry vision/seeing spots, N/V, epigastric pain, swelling in face and hands, sudden weight gain. Pt denies chest pain and SOB.  Pt denies constipation, diarrhea, or urinary problems. Pt denies fever, chills, fatigue, sweating or changes in appetite. Pt denies dizziness, light-headedness, weakness.  Pt denies VB, ctx, LOF and reports good FM.  Current pregnancy problems? preeclampsia Blood Type? O Positive Allergies? Wellbutrin Current medications? Procardia, sertraline, low dose ASA Current PNC & next appt? [facility]   OB History     Gravida  5   Para  3   Term  3   Preterm      AB  1   Living  3      SAB      IAB      Ectopic  1   Multiple  0   Live Births  3           Past Medical History:  Diagnosis Date   Abnormal Pap smear 2011   Anxiety    Depression    H/O thyroid nodule    MD just watching, no treatment/no meds   HPV in female    Seasonal allergies    SVD (spontaneous vaginal delivery)    x 3   Thyroid disease    Nodules- MD just watching, no treatments/no meds   Thyroid nodule    Vaginal Pap smear, abnormal     Past Surgical History:  Procedure Laterality Date    CHOLECYSTECTOMY  2011   COLPOSCOPY     DIAGNOSTIC LAPAROSCOPY WITH REMOVAL OF ECTOPIC PREGNANCY Bilateral 05/09/2018   Procedure: DIAGNOSTIC LAPAROSCOPY WITH REMOVAL OF ECTOPIC PREGNANCY;  Surgeon: 05/11/2018, DO;  Location: [facility];  Service: Gynecology;  Laterality: Bilateral;  bialteral salpingectomy   HYSTEROSCOPY N/A 01/22/2021   Procedure: HYSTEROSCOPY;  Surgeon: 03/24/2021, MD;  Location: [facility];  Service: Gynecology;  Laterality: N/A;   LAPAROSCOPIC BILATERAL SALPINGECTOMY Bilateral 05/09/2018   Procedure: LAPAROSCOPIC BILATERAL SALPINGECTOMY;  Surgeon: 05/11/2018, DO;  Location: [facility];  Service: Gynecology;  Laterality: Bilateral;   LAPAROSCOPIC TUBAL LIGATION Bilateral 03/17/2018   Procedure: LAPAROSCOPIC TUBAL LIGATION WITH FILSHIE CLIPS;  Surgeon: 03/19/2018, MD;  Location: [facility];  Service: Gynecology;  Laterality: Bilateral;   TUBAL LIGATION      Family History  Problem Relation Age of Onset   Heart disease Maternal Uncle    Heart attack Maternal Uncle 30   Emphysema Maternal Grandmother        Smoker   Thyroid disease Maternal Grandmother    Aneurysm Maternal Grandmother        AAA -- s/p 2 surgical procedures  Hyperlipidemia Maternal Grandmother    Cancer Maternal Grandmother        Skin   Heart disease Maternal Grandfather        5 bypass   Diabetes Maternal Grandfather    Hypertension Maternal Grandfather    Healthy Son    ADD / ADHD Son    Anemia Son    Asthma Son    Healthy Daughter     Social History   Tobacco Use   Smoking status: Former    Packs/day: 0.50    Years: 10.00    Pack years: 5.00    Types: Cigarettes    Quit date: 09/23/2015    Years since quitting: 6.0   Smokeless tobacco: Never  Vaping Use   Vaping Use: Never used  Substance Use Topics   Alcohol use: Not Currently   Drug use: No    Allergies:  Allergies  Allergen Reactions   Bupropion Other (See Comments), Hives and Rash    Wellbutrin [Bupropion Hcl] Hives    Medications Prior to Admission  Medication Sig Dispense Refill Last Dose   acetaminophen (TYLENOL) 500 MG tablet Take 500 mg by mouth every 6 (six) hours as needed.   10/20/2021 at 0930   aspirin EC 81 MG tablet Take 81 mg by mouth daily. Swallow whole.   10/19/2021   NIFEdipine (PROCARDIA-XL/NIFEDICAL-XL) 30 MG 24 hr tablet Take 30 mg by mouth daily.   10/20/2021 at 0720   Prenatal Vit-Fe Fumarate-FA (PRENATAL MULTIVITAMIN) TABS tablet Take 1 tablet by mouth daily at 12 noon.   10/20/2021   sertraline (ZOLOFT) 50 MG tablet Take 50 mg by mouth daily.   Past Week   oseltamivir (TAMIFLU) 75 MG capsule Take 1 capsule (75 mg total) by mouth every 12 (twelve) hours. 10 capsule 0     Review of Systems  Constitutional:  Negative for chills, diaphoresis, fatigue and fever.  Eyes:  Negative for visual disturbance.  Respiratory:  Negative for shortness of breath.   Cardiovascular:  Negative for chest pain.  Gastrointestinal:  Negative for abdominal pain, constipation, diarrhea, nausea and vomiting.  Genitourinary:  Negative for dysuria, flank pain, frequency, pelvic pain, urgency, vaginal bleeding and vaginal discharge.  Neurological:  Positive for headaches. Negative for dizziness, weakness and light-headedness.   Physical Exam   Blood pressure 121/74, pulse (!) 106, temperature 98 F (36.7 C), temperature source Oral, resp. rate 18, last menstrual period 01/01/2021, SpO2 99 %.  Patient Vitals for the past 24 hrs:  BP Temp Temp src Pulse Resp SpO2  10/20/21 1601 121/74 -- -- (!) 106 -- --  10/20/21 1546 (!) 151/73 -- -- (!) 105 -- --  10/20/21 1501 (!) 109/52 -- -- 91 -- --  10/20/21 1446 121/73 -- -- 92 -- --  10/20/21 1431 125/75 -- -- 95 -- --  10/20/21 1416 131/85 -- -- 96 -- --  10/20/21 1401 135/81 -- -- 92 -- --  10/20/21 1346 118/78 -- -- 94 -- --  10/20/21 1331 125/68 -- -- 96 -- --  10/20/21 1316 129/75 -- -- (!) 104 -- --  10/20/21 1301  126/74 -- -- (!) 106 -- --  10/20/21 1244 138/90 -- -- -- -- --  10/20/21 1231 131/76 -- -- (!) 103 -- --  10/20/21 1223 136/77 98 F (36.7 C) Oral (!) 106 18 99 %   Physical Exam Vitals and nursing note reviewed. Exam conducted with a chaperone present.  Constitutional:      General: She is not  in acute distress.    Appearance: Normal appearance. She is not ill-appearing, toxic-appearing or diaphoretic.  HENT:     Head: Normocephalic and atraumatic.  Pulmonary:     Effort: Pulmonary effort is normal.  Genitourinary:    General: Normal vulva.     Labia:        Right: No rash, tenderness or lesion.        Left: No rash, tenderness or lesion.      Comments: CE: long/1.5 (performed as patient reporting cramping and stomach tightening) Skin:    General: Skin is warm and dry.  Neurological:     Mental Status: She is alert and oriented to person, place, and time.  Psychiatric:        Mood and Affect: Mood normal.        Behavior: Behavior normal.        Thought Content: Thought content normal.        Judgment: Judgment normal.   Results for orders placed or performed during the hospital encounter of 10/20/21 (from the past 24 hour(s))  CBC with Differential/Platelet     Status: Abnormal   Collection Time: 10/20/21 12:24 PM  Result Value Ref Range   WBC 9.1 4.0 - 10.5 K/uL   RBC 3.80 (L) 3.87 - 5.11 MIL/uL   Hemoglobin 11.1 (L) 12.0 - 15.0 g/dL   HCT 34.1 (L) 96.2 - 22.9 %   MCV 87.6 80.0 - 100.0 fL   MCH 29.2 26.0 - 34.0 pg   MCHC 33.3 30.0 - 36.0 g/dL   RDW 79.8 92.1 - 19.4 %   Platelets 252 150 - 400 K/uL   nRBC 0.0 0.0 - 0.2 %   Neutrophils Relative % 73 %   Neutro Abs 6.7 1.7 - 7.7 K/uL   Lymphocytes Relative 17 %   Lymphs Abs 1.5 0.7 - 4.0 K/uL   Monocytes Relative 9 %   Monocytes Absolute 0.8 0.1 - 1.0 K/uL   Eosinophils Relative 0 %   Eosinophils Absolute 0.0 0.0 - 0.5 K/uL   Basophils Relative 0 %   Basophils Absolute 0.0 0.0 - 0.1 K/uL   Immature Granulocytes  1 %   Abs Immature Granulocytes 0.08 (H) 0.00 - 0.07 K/uL  Comprehensive metabolic panel     Status: Abnormal   Collection Time: 10/20/21 12:24 PM  Result Value Ref Range   Sodium 135 135 - 145 mmol/L   Potassium 3.5 3.5 - 5.1 mmol/L   Chloride 105 98 - 111 mmol/L   CO2 21 (L) 22 - 32 mmol/L   Glucose, Bld 95 70 - 99 mg/dL   BUN <5 (L) 6 - 20 mg/dL   Creatinine, Ser 1.74 0.44 - 1.00 mg/dL   Calcium 8.9 8.9 - 08.1 mg/dL   Total Protein 6.2 (L) 6.5 - 8.1 g/dL   Albumin 2.8 (L) 3.5 - 5.0 g/dL   AST 15 15 - 41 U/L   ALT 13 0 - 44 U/L   Alkaline Phosphatase 176 (H) 38 - 126 U/L   Total Bilirubin 0.5 0.3 - 1.2 mg/dL   GFR, Estimated >44 >81 mL/min   Anion gap 9 5 - 15  Protein / creatinine ratio, urine     Status: Abnormal   Collection Time: 10/20/21 12:44 PM  Result Value Ref Range   Creatinine, Urine 140.47 mg/dL   Total Protein, Urine 79 mg/dL   Protein Creatinine Ratio 0.56 (H) 0.00 - 0.15 mg/mg[Cre]   No results found.  MAU Course  Procedures  MDM -preeclampsia evaluation with out severe range BP in MAU on admission -symptoms include: HA; Reglan 10mg  given, pt declines any medications with sedating side effect as she does not want to go home drowsy. Flexeril had initially been ordered and Benadryl planned if HA unrelieved, but patient declines. -CBC: H/H 11.1/33.3, platelets 252 -CMP: serum creatinine 0.66, AST/ALT 15/13 -PCr: 0.56 -EFM: reactive       -baseline: 140       -variability: moderate       -accels: present, 15x15       -decels: absent       -TOCO: irregular ctx, pt reports feeling cramping and tightening -after medication administration, pt reports HA now unchanged -consulted with Dr. 05-06-1997, who recommends pt be admitted for observation and not move towards delivery at this time -called Dr. Donavan Foil who agrees with recommendation and is also not moving towards delivery at this time. Patient informed of admission for observation only without plan for delivery at  this time. -consulted with NICU, pt OK to be admitted to [facility] -admit to [facility]  Orders Placed This Encounter  Procedures   Resp Panel by RT-PCR (Flu A&B, Covid) Nasopharyngeal Swab    Standing Status:   Standing    Number of Occurrences:   1   CBC with Differential/Platelet    Standing Status:   Standing    Number of Occurrences:   1   Comprehensive metabolic panel    Standing Status:   Standing    Number of Occurrences:   1   Protein / creatinine ratio, urine    Standing Status:   Standing    Number of Occurrences:   1   Admit to Inpatient (patient's expected length of stay will be greater than 2 midnights or inpatient only procedure)    Standing Status:   Standing    Number of Occurrences:   1    Order Specific Question:   Hospital Area    Answer:   [facility] [[id number]]    Order Specific Question:   Level of Care    Answer:   Antepartum [20]    Order Specific Question:   Covid Evaluation    Answer:   Asymptomatic Screening Protocol (No Symptoms)    Order Specific Question:   Diagnosis    Answer:   Preeclampsia [facility]    Order Specific Question:   Admitting Physician    Answer:   [[id number] [[id number]]    Order Specific Question:   Attending Physician    Answer:   Marcelle Overlie [[id number]]    Order Specific Question:   Estimated length of stay    Answer:   past midnight tomorrow    Order Specific Question:   Certification:    Answer:   I certify this patient will need inpatient services for at least 2 midnights   Meds ordered this encounter  Medications   DISCONTD: cyclobenzaprine (FLEXERIL) tablet 10 mg   metoCLOPramide (REGLAN) tablet 10 mg   Assessment and Plan   1. Pre-eclampsia in third trimester   2. Pregnancy headache in third trimester   3. [redacted] weeks gestation of pregnancy   4. NST (non-stress test) reactive    -admit to [facility] 10/20/2021, 4:41 PM

## 2021-10-20 NOTE — MAU Note (Signed)
.  Brandi Burns is a 35 y.o. at [redacted]w[redacted]d here in MAU reporting: sent from MD office for elevated BP (150s/100s) and headache. Took 1000 mg of tylenol at 0930. Denies visual changes or epigastric pain.   Vitals:   10/20/21 1223  BP: 136/77  Pulse: (!) 106  SpO2: 99%     FHT:151 Lab orders placed from triage:  UA

## 2021-10-21 LAB — CBC WITH DIFFERENTIAL/PLATELET
Abs Immature Granulocytes: 0.06 10*3/uL (ref 0.00–0.07)
Basophils Absolute: 0 10*3/uL (ref 0.0–0.1)
Basophils Relative: 0 %
Eosinophils Absolute: 0 10*3/uL (ref 0.0–0.5)
Eosinophils Relative: 0 %
HCT: 28.8 % — ABNORMAL LOW (ref 36.0–46.0)
Hemoglobin: 9.5 g/dL — ABNORMAL LOW (ref 12.0–15.0)
Immature Granulocytes: 1 %
Lymphocytes Relative: 22 %
Lymphs Abs: 1.6 10*3/uL (ref 0.7–4.0)
MCH: 29.6 pg (ref 26.0–34.0)
MCHC: 33 g/dL (ref 30.0–36.0)
MCV: 89.7 fL (ref 80.0–100.0)
Monocytes Absolute: 0.6 10*3/uL (ref 0.1–1.0)
Monocytes Relative: 8 %
Neutro Abs: 5.2 10*3/uL (ref 1.7–7.7)
Neutrophils Relative %: 69 %
Platelets: 210 10*3/uL (ref 150–400)
RBC: 3.21 MIL/uL — ABNORMAL LOW (ref 3.87–5.11)
RDW: 14.2 % (ref 11.5–15.5)
WBC: 7.5 10*3/uL (ref 4.0–10.5)
nRBC: 0 % (ref 0.0–0.2)

## 2021-10-21 LAB — COMPREHENSIVE METABOLIC PANEL
ALT: 11 U/L (ref 0–44)
AST: 13 U/L — ABNORMAL LOW (ref 15–41)
Albumin: 2.4 g/dL — ABNORMAL LOW (ref 3.5–5.0)
Alkaline Phosphatase: 151 U/L — ABNORMAL HIGH (ref 38–126)
Anion gap: 6 (ref 5–15)
BUN: <5 mg/dL — ABNORMAL LOW (ref 6–20)
CO2: 22 mmol/L (ref 22–32)
Calcium: 8.5 mg/dL — ABNORMAL LOW (ref 8.9–10.3)
Chloride: 108 mmol/L (ref 98–111)
Creatinine, Ser: 0.52 mg/dL (ref 0.44–1.00)
GFR, Estimated: >60 mL/min (ref >60)
Glucose, Bld: 101 mg/dL — ABNORMAL HIGH (ref 70–99)
Potassium: 3.6 mmol/L (ref 3.5–5.1)
Sodium: 136 mmol/L (ref 135–145)
Total Bilirubin: 0.5 mg/dL (ref 0.3–1.2)
Total Protein: 5.1 g/dL — ABNORMAL LOW (ref 6.5–8.1)

## 2021-10-21 NOTE — Discharge Summary (Signed)
Physician Discharge Summary  Patient ID: LUA FENG MRN: [id number] DOB/AGE: 34-May-1987 34 y.o.  Admit date: 10/20/2021 Discharge date: 10/21/2021  Admission Diagnoses:  Discharge Diagnoses:  Principal Problem:   Preeclampsia   Discharged Condition: good  Hospital Course: 35 yo admitted with mild PIH s/s mild to moderate BP elevation in the office and a mild HA. She has likely SIPE with an element of cHTN and has been on Procardia 70m for several weeks. She had normal labs and Bps were all completely normal. Her HA resolved with tylenol and one dose of Fioricet. She has fu in the office in two days.   Consults: None  Significant Diagnostic Studies: labs: CBC    Component Value Date/Time   WBC 7.5 10/21/2021 0712   RBC 3.21 (L) 10/21/2021 0712   HGB 9.5 (L) 10/21/2021 0712   HCT 28.8 (L) 10/21/2021 0712   PLT 210 10/21/2021 0712   MCV 89.7 10/21/2021 0712   MCV 98.2 (A) 04/21/2013 1402   MCH 29.6 10/21/2021 0712   MCHC 33.0 10/21/2021 0712   RDW 14.2 10/21/2021 0712   LYMPHSABS 1.6 10/21/2021 0712   MONOABS 0.6 10/21/2021 0712   EOSABS 0.0 10/21/2021 0712   BASOSABS 0.0 10/21/2021 0712   CMP     Component Value Date/Time   NA 136 10/21/2021 0712   K 3.6 10/21/2021 0712   CL 108 10/21/2021 0712   CO2 22 10/21/2021 0712   GLUCOSE 101 (H) 10/21/2021 0712   BUN <5 (L) 10/21/2021 0712   CREATININE 0.52 10/21/2021 0712   CREATININE 0.60 12/05/2012 1917   CALCIUM 8.5 (L) 10/21/2021 0712   PROT 5.1 (L) 10/21/2021 0712   ALBUMIN 2.4 (L) 10/21/2021 0712   AST 13 (L) 10/21/2021 0712   ALT 11 10/21/2021 0712   ALKPHOS 151 (H) 10/21/2021 0712   BILITOT 0.5 10/21/2021 0712   GFRNONAA >60 10/21/2021 0712   GFRAA  09/06/2010 0025    >60        The eGFR has been calculated using the MDRD equation. This calculation has not been validated in all clinical situations. eGFR's persistently <60 mL/min signify possible Chronic Kidney Disease.      Treatments: IV  hydration  Discharge Exam: Blood pressure 123/70, pulse 86, temperature 97.7 F (36.5 C), temperature source Oral, resp. rate 16, height _0  (1.676 m), weight 83.9 kg, last menstrual period 01/01/2021, SpO2 100 %. General appearance: alert and cooperative GI: soft, non-tender; bowel sounds normal; no masses,  no organomegaly Extremities: extremities normal, atraumatic, no cyanosis or edema  Disposition: Discharge disposition: 01-Home or Self Care       Discharge Instructions     Discharge activity:  Up to eat   Complete by: As directed    Discharge diet:  No restrictions   Complete by: As directed    Fetal Kick Count:  Lie on our left side for one hour after a meal, and count the number of times your baby kicks.  If it is less than 5 times, get up, move around and drink some juice.  Repeat the test 30 minutes later.  If it is still less than 5 kicks in an hour, notify your doctor.   Complete by: As directed    No sexual activity restrictions   Complete by: As directed    Notify physician for a general feeling that "something is not right"   Complete by: As directed    Notify physician for increase or change in vaginal discharge  Complete by: As directed    Notify physician for intestinal cramps, with or without diarrhea, sometimes described as "gas pain"   Complete by: As directed    Notify physician for leaking of fluid   Complete by: As directed    Notify physician for low, dull backache, unrelieved by heat or Tylenol   Complete by: As directed    Notify physician for menstrual like cramps   Complete by: As directed    Notify physician for pelvic pressure   Complete by: As directed    Notify physician for uterine contractions.  These may be painless and feel like the uterus is tightening or the baby is  "balling up"   Complete by: As directed    Notify physician for vaginal bleeding   Complete by: As directed    PRETERM LABOR:  Includes any of the follwing symptoms  that occur between 20 - [redacted] weeks gestation.  If these symptoms are not stopped, preterm labor can result in preterm delivery, placing your baby at risk   Complete by: As directed       Allergies as of 10/21/2021       Reactions   Bupropion Other (See Comments), Hives, Rash   Wellbutrin [bupropion Hcl] Hives        Medication List     STOP taking these medications    oseltamivir 75 MG capsule Commonly known as: TAMIFLU       TAKE these medications    acetaminophen 500 MG tablet Commonly known as: TYLENOL Take 500 mg by mouth every 6 (six) hours as needed.   aspirin EC 81 MG tablet Take 81 mg by mouth daily. Swallow whole.   NIFEdipine 30 MG 24 hr tablet Commonly known as: PROCARDIA-XL/NIFEDICAL-XL Take 30 mg by mouth daily.   prenatal multivitamin Tabs tablet Take 1 tablet by mouth daily at 12 noon.   sertraline 50 MG tablet Commonly known as: ZOLOFT Take 50 mg by mouth daily.         Signed: Tyson Dense 10/21/2021, 8:46 AM

## 2021-10-30 ENCOUNTER — Inpatient Hospital Stay (HOSPITAL_COMMUNITY)
Admission: AD | Admit: 2021-10-30 | Discharge: 2021-10-31 | Disposition: A | Discharge status: Home / Self Care | Payer: 59 | Attending: Obstetrics and Gynecology | Admitting: Obstetrics and Gynecology

## 2021-10-30 ENCOUNTER — Encounter (HOSPITAL_COMMUNITY): Payer: Self-pay | Admitting: Obstetrics and Gynecology

## 2021-10-30 DIAGNOSIS — Z3A36 36 weeks gestation of pregnancy: Secondary | ICD-10-CM | POA: Insufficient documentation

## 2021-10-30 DIAGNOSIS — Z3689 Encounter for other specified antenatal screening: Secondary | ICD-10-CM | POA: Diagnosis not present

## 2021-10-30 DIAGNOSIS — O4703 False labor before 37 completed weeks of gestation, third trimester: Secondary | ICD-10-CM | POA: Diagnosis not present

## 2021-10-30 LAB — POCT FERN TEST: POCT Fern Test: NEGATIVE

## 2021-10-30 LAB — AMNISURE RUPTURE OF MEMBRANE (ROM) NOT AT ARMC: Amnisure ROM: NEGATIVE

## 2021-10-30 NOTE — MAU Note (Addendum)
Pt reports ctx on and off all day. Denies any vag bleeding. Reports she may have had some leaking not sure . Good fetal movement felt. 2,5/50 on Monday in office. Pt stated she has been dx with preeclamsia and has induction scheduled for 12/15.

## 2021-10-31 DIAGNOSIS — O4703 False labor before 37 completed weeks of gestation, third trimester: Secondary | ICD-10-CM | POA: Diagnosis not present

## 2021-10-31 NOTE — MAU Note (Signed)
Pt wants to walk for an hour before they go home to see if she dilates. Lives 35 min away . Notified provider and is ok with plan to recheck in another hour after walking.

## 2021-10-31 NOTE — MAU Provider Note (Signed)
S: [name] is a 35 y.o. [id number] at [redacted]w[redacted]d  who presents to MAU today for labor evaluation.  Nurse reports patient without significant change after 2 hours of evaluation.  Reports amnisure returns negative.   Cervical exam by RN:  Dilation: 2.5 Effacement (%): 50 Cervical Position: Posterior, Middle Station: -2 Presentation: Vertex Exam by:: Joline Salt, RN  Fetal Monitoring: Baseline: 145 Variability: Moderate Accelerations: Present Decelerations: None Contractions: Q2-82min  MDM Discussed patient with RN. NST reviewed.   A: SIUP at [redacted]w[redacted]d  False labor  P: -Provider placing discharge and, per nurse, patient requests additional hour to ambulate. -Patient states she lives 30 minutes away. -NST remains reactive. -Okay to ambulate. -Plan to recheck.  Gerrit Heck, CNM 10/31/2021 12:13 AM  Reassessment (1:35 AM)  -No cervical change -Okay to discharge.  Cherre Robins MSN, CNM Advanced Practice Provider, [facility]

## 2021-11-03 NOTE — H&P (Signed)
Brandi Burns is a 35 year old G 5 P 3 presenting at 52 wga for pre-eclampsia without severe features. She is a gestational carrier for Rosanne Sack and Theora Gianotti. This is a PGA-tested female. She has been followed in the office for [facility] and controlled with nifedipine 30mg . She has a known LGA fetus with the last at 71 wga with EFW of 6#15. Her largest baby born vaginally was 7#13. She has anxiety well controlled with sertraline. She got BMZ in late November.   OB History     Gravida  5   Para  3   Term  3   Preterm      AB  1   Living  3      SAB      IAB      Ectopic  1   Multiple  0   Live Births  3          Past Medical History:  Diagnosis Date   Abnormal Pap smear 2011   Anxiety    Depression    H/O thyroid nodule    MD just watching, no treatment/no meds   HPV in female    Seasonal allergies    SVD (spontaneous vaginal delivery)    x 3   Thyroid disease    Nodules- MD just watching, no treatments/no meds   Thyroid nodule    Vaginal Pap smear, abnormal    Past Surgical History:  Procedure Laterality Date   CHOLECYSTECTOMY  2011   COLPOSCOPY     DIAGNOSTIC LAPAROSCOPY WITH REMOVAL OF ECTOPIC PREGNANCY Bilateral 05/09/2018   Procedure: DIAGNOSTIC LAPAROSCOPY WITH REMOVAL OF ECTOPIC PREGNANCY;  Surgeon: 05/11/2018, DO;  Location: [facility];  Service: Gynecology;  Laterality: Bilateral;  bialteral salpingectomy   HYSTEROSCOPY N/A 01/22/2021   Procedure: HYSTEROSCOPY;  Surgeon: 03/24/2021, MD;  Location: [facility];  Service: Gynecology;  Laterality: N/A;   LAPAROSCOPIC BILATERAL SALPINGECTOMY Bilateral 05/09/2018   Procedure: LAPAROSCOPIC BILATERAL SALPINGECTOMY;  Surgeon: 05/11/2018, DO;  Location: [facility];  Service: Gynecology;  Laterality: Bilateral;   LAPAROSCOPIC TUBAL LIGATION Bilateral 03/17/2018   Procedure: LAPAROSCOPIC TUBAL LIGATION WITH FILSHIE CLIPS;  Surgeon: 03/19/2018, MD;  Location: [facility];  Service:  Gynecology;  Laterality: Bilateral;   TUBAL LIGATION     Family History: family history includes ADD / ADHD in her son; Anemia in her son; Aneurysm in her maternal grandmother; Asthma in her son; Cancer in her maternal grandmother; Diabetes in her maternal grandfather; Emphysema in her maternal grandmother; Healthy in her daughter and son; Heart attack (age of onset: 51) in her maternal uncle; Heart disease in her maternal grandfather and maternal uncle; Hyperlipidemia in her maternal grandmother; Hypertension in her maternal grandfather; Thyroid disease in her maternal grandmother. Social History:  reports that she quit smoking about 6 years ago. Her smoking use included cigarettes. She has a 5.00 pack-year smoking history. She has never used smokeless tobacco. She reports that she does not currently use alcohol. She reports that she does not use drugs.     Maternal Diabetes: No Genetic Screening: Normal Maternal Ultrasounds/Referrals: Normal Fetal Ultrasounds or other Referrals:  None Maternal Substance Abuse:  No Significant Maternal Medications:  None Significant Maternal Lab Results:  None Other Comments:  None  Review of Systems  All other systems reviewed and are negative. History   Last menstrual period 01/01/2021.   Fetal Exam Fetal State Assessment: Category I - tracings are normal.  Physical Exam Vitals and nursing note reviewed.  Constitutional:      Appearance: She is well-developed.  Cardiovascular:     Rate and Rhythm: Normal rate and regular rhythm.  Neurological:     Mental Status: She is alert.    Prenatal labs: ABO, Rh: --/--/O POS (03/01 5597) Antibody: NEG (03/01 0859) Rubella:   RPR:    HBsAg:    HIV:    GBS:   negative  Prior to Admission medications   Medication Sig Start Date End Date Taking? Authorizing Provider  acetaminophen (TYLENOL) 500 MG tablet Take 500 mg by mouth every 6 (six) hours as needed.   Yes [provider]  aspirin EC  81 MG tablet Take 81 mg by mouth daily. Swallow whole.   Yes [provider]  NIFEdipine (PROCARDIA-XL/NIFEDICAL-XL) 30 MG 24 hr tablet Take 30 mg by mouth daily.   Yes [provider]  Prenatal Vit-Fe Fumarate-FA (PRENATAL MULTIVITAMIN) TABS tablet Take 1 tablet by mouth daily at 12 noon.   Yes [provider]  sertraline (ZOLOFT) 50 MG tablet Take 50 mg by mouth daily.   Yes [provider]  oseltamivir (TAMIFLU) 75 MG capsule Take 1 capsule (75 mg total) by mouth every 12 (twelve) hours. 10/06/21   Marylene Land, CNM    Assessment/Plan: 35 yo G5P3 @ 31 wga presenting for IOL s/s PIH without severe features. Mag prn severe features.  Continue sertraline.  Continue procardia 30mg  until pp.  Cervix unfavorable. Plan for cytotec followed by pitocin/AROM when more favorable.  GBS negative.   11/03/2021, 4:21 PM

## 2021-11-04 ENCOUNTER — Other Ambulatory Visit (HOSPITAL_COMMUNITY): Payer: Self-pay

## 2021-11-04 ENCOUNTER — Other Ambulatory Visit: Payer: Self-pay | Admitting: Obstetrics and Gynecology

## 2021-11-04 LAB — SARS CORONAVIRUS 2 (TAT 6-24 HRS): SARS Coronavirus 2: NEGATIVE

## 2021-11-05 ENCOUNTER — Inpatient Hospital Stay (HOSPITAL_COMMUNITY)
Admission: AD | Admit: 2021-11-05 | Discharge: 2021-11-06 | DRG: 807 | Disposition: A | Discharge status: Home / Self Care | Payer: 59 | Attending: Obstetrics and Gynecology | Admitting: Obstetrics and Gynecology

## 2021-11-05 ENCOUNTER — Inpatient Hospital Stay (HOSPITAL_COMMUNITY): Payer: 59

## 2021-11-05 ENCOUNTER — Inpatient Hospital Stay (HOSPITAL_COMMUNITY): Payer: 59 | Admitting: Anesthesiology

## 2021-11-05 ENCOUNTER — Encounter (HOSPITAL_COMMUNITY): Payer: Self-pay | Admitting: Obstetrics and Gynecology

## 2021-11-05 ENCOUNTER — Other Ambulatory Visit: Payer: Self-pay

## 2021-11-05 DIAGNOSIS — Z7982 Long term (current) use of aspirin: Secondary | ICD-10-CM

## 2021-11-05 DIAGNOSIS — Z3A37 37 weeks gestation of pregnancy: Secondary | ICD-10-CM

## 2021-11-05 DIAGNOSIS — O99344 Other mental disorders complicating childbirth: Secondary | ICD-10-CM | POA: Diagnosis present

## 2021-11-05 DIAGNOSIS — O134 Gestational [pregnancy-induced] hypertension without significant proteinuria, complicating childbirth: Principal | ICD-10-CM | POA: Diagnosis present

## 2021-11-05 DIAGNOSIS — Z87891 Personal history of nicotine dependence: Secondary | ICD-10-CM | POA: Diagnosis not present

## 2021-11-05 DIAGNOSIS — O3663X0 Maternal care for excessive fetal growth, third trimester, not applicable or unspecified: Secondary | ICD-10-CM | POA: Diagnosis present

## 2021-11-05 DIAGNOSIS — Z3490 Encounter for supervision of normal pregnancy, unspecified, unspecified trimester: Secondary | ICD-10-CM

## 2021-11-05 DIAGNOSIS — F419 Anxiety disorder, unspecified: Secondary | ICD-10-CM | POA: Diagnosis present

## 2021-11-05 LAB — CBC
HCT: 34.3 % — ABNORMAL LOW (ref 36.0–46.0)
HCT: 32 % — ABNORMAL LOW (ref 36.0–46.0)
Hemoglobin: 10.9 g/dL — ABNORMAL LOW (ref 12.0–15.0)
Hemoglobin: 10.2 g/dL — ABNORMAL LOW (ref 12.0–15.0)
MCH: 27.5 pg (ref 26.0–34.0)
MCH: 27.8 pg (ref 26.0–34.0)
MCHC: 31.8 g/dL (ref 30.0–36.0)
MCHC: 31.9 g/dL (ref 30.0–36.0)
MCV: 86.4 fL (ref 80.0–100.0)
MCV: 87.2 fL (ref 80.0–100.0)
Platelets: 288 10*3/uL (ref 150–400)
Platelets: 264 10*3/uL (ref 150–400)
RBC: 3.97 MIL/uL (ref 3.87–5.11)
RBC: 3.67 MIL/uL — ABNORMAL LOW (ref 3.87–5.11)
RDW: 14.4 % (ref 11.5–15.5)
RDW: 14.5 % (ref 11.5–15.5)
WBC: 7.9 10*3/uL (ref 4.0–10.5)
WBC: 8.4 10*3/uL (ref 4.0–10.5)
nRBC: 0 % (ref 0.0–0.2)
nRBC: 0 % (ref 0.0–0.2)

## 2021-11-05 LAB — COMPREHENSIVE METABOLIC PANEL
ALT: 10 U/L (ref 0–44)
AST: 17 U/L (ref 15–41)
Albumin: 2.7 g/dL — ABNORMAL LOW (ref 3.5–5.0)
Alkaline Phosphatase: 249 U/L — ABNORMAL HIGH (ref 38–126)
Anion gap: 9 (ref 5–15)
BUN: <5 mg/dL — ABNORMAL LOW (ref 6–20)
CO2: 20 mmol/L — ABNORMAL LOW (ref 22–32)
Calcium: 9 mg/dL (ref 8.9–10.3)
Chloride: 105 mmol/L (ref 98–111)
Creatinine, Ser: 0.61 mg/dL (ref 0.44–1.00)
GFR, Estimated: >60 mL/min (ref >60)
Glucose, Bld: 90 mg/dL (ref 70–99)
Potassium: 3.7 mmol/L (ref 3.5–5.1)
Sodium: 134 mmol/L — ABNORMAL LOW (ref 135–145)
Total Bilirubin: 0.4 mg/dL (ref 0.3–1.2)
Total Protein: 6.3 g/dL — ABNORMAL LOW (ref 6.5–8.1)

## 2021-11-05 LAB — TYPE AND SCREEN
ABO/RH(D): O POS
Antibody Screen: NEGATIVE

## 2021-11-05 LAB — RPR: RPR Ser Ql: NONREACTIVE

## 2021-11-05 MED ORDER — NIFEDIPINE ER OSMOTIC RELEASE 30 MG PO TB24
30.0000 mg | ORAL_TABLET | Freq: Every day | ORAL | Status: DC
  Filled 2021-11-05: qty 1

## 2021-11-05 MED ORDER — WITCH HAZEL-GLYCERIN EX PADS: 1.0000 "application " | MEDICATED_PAD | CUTANEOUS | Status: DC | PRN

## 2021-11-05 MED ORDER — LIDOCAINE HCL (PF) 1 % IJ SOLN: 30.0000 mL | INTRAMUSCULAR | Status: DC | PRN

## 2021-11-05 MED ORDER — OXYCODONE-ACETAMINOPHEN 5-325 MG PO TABS: 1.0000 | ORAL_TABLET | ORAL | Status: DC | PRN

## 2021-11-05 MED ORDER — PHENYLEPHRINE 40 MCG/ML (10ML) SYRINGE FOR IV PUSH (FOR BLOOD PRESSURE SUPPORT): 80.0000 ug | PREFILLED_SYRINGE | INTRAVENOUS | Status: DC | PRN

## 2021-11-05 MED ORDER — BENZOCAINE-MENTHOL 20-0.5 % EX AERO
1.0000 "application " | INHALATION_SPRAY | CUTANEOUS | Status: DC | PRN
  Administered 2021-11-05: 1 via TOPICAL
  Filled 2021-11-05: qty 56

## 2021-11-05 MED ORDER — ZOLPIDEM TARTRATE 5 MG PO TABS: 5.0000 mg | ORAL_TABLET | Freq: Every evening | ORAL | Status: DC | PRN

## 2021-11-05 MED ORDER — HYDROXYZINE HCL 50 MG PO TABS: 50.0000 mg | ORAL_TABLET | Freq: Four times a day (QID) | ORAL | Status: DC | PRN

## 2021-11-05 MED ORDER — LACTATED RINGERS IV SOLN: INTRAVENOUS | Status: DC

## 2021-11-05 MED ORDER — EPHEDRINE 5 MG/ML INJ: 10.0000 mg | INTRAVENOUS | Status: DC | PRN

## 2021-11-05 MED ORDER — SENNOSIDES-DOCUSATE SODIUM 8.6-50 MG PO TABS: 2.0000 | ORAL_TABLET | ORAL | Status: DC

## 2021-11-05 MED ORDER — IBUPROFEN 600 MG PO TABS
600.0000 mg | ORAL_TABLET | Freq: Four times a day (QID) | ORAL | Status: DC
  Administered 2021-11-05 – 2021-11-06 (×4): 600 mg via ORAL
  Filled 2021-11-05 (×4): qty 1

## 2021-11-05 MED ORDER — PRENATAL MULTIVITAMIN CH
1.0000 | ORAL_TABLET | Freq: Every day | ORAL | Status: DC
  Administered 2021-11-06: 1 via ORAL
  Filled 2021-11-05: qty 1

## 2021-11-05 MED ORDER — LACTATED RINGERS IV SOLN: 500.0000 mL | INTRAVENOUS | Status: DC | PRN

## 2021-11-05 MED ORDER — OXYTOCIN-SODIUM CHLORIDE 30-0.9 UT/500ML-% IV SOLN
1.0000 m[IU]/min | INTRAVENOUS | Status: DC
  Administered 2021-11-05: 2 m[IU]/min via INTRAVENOUS
  Filled 2021-11-05: qty 500

## 2021-11-05 MED ORDER — SIMETHICONE 80 MG PO CHEW: 80.0000 mg | CHEWABLE_TABLET | ORAL | Status: DC | PRN

## 2021-11-05 MED ORDER — LACTATED RINGERS IV SOLN
500.0000 mL | Freq: Once | INTRAVENOUS | Status: AC
  Administered 2021-11-05: 500 mL via INTRAVENOUS

## 2021-11-05 MED ORDER — ONDANSETRON HCL 4 MG PO TABS: 4.0000 mg | ORAL_TABLET | ORAL | Status: DC | PRN

## 2021-11-05 MED ORDER — FENTANYL-BUPIVACAINE-NACL 0.5-0.125-0.9 MG/250ML-% EP SOLN
12.0000 mL/h | EPIDURAL | Status: DC | PRN
  Administered 2021-11-05: 12 mL/h via EPIDURAL
  Filled 2021-11-05: qty 250

## 2021-11-05 MED ORDER — TERBUTALINE SULFATE 1 MG/ML IJ SOLN: 0.2500 mg | Freq: Once | INTRAMUSCULAR | Status: DC | PRN

## 2021-11-05 MED ORDER — LIDOCAINE HCL (PF) 1 % IJ SOLN
INTRAMUSCULAR | Status: DC | PRN
  Administered 2021-11-05: 6 mL via EPIDURAL

## 2021-11-05 MED ORDER — BUTORPHANOL TARTRATE 1 MG/ML IJ SOLN: 1.0000 mg | INTRAMUSCULAR | Status: DC | PRN

## 2021-11-05 MED ORDER — OXYCODONE HCL 5 MG PO TABS: 10.0000 mg | ORAL_TABLET | ORAL | Status: DC | PRN

## 2021-11-05 MED ORDER — ACETAMINOPHEN 325 MG PO TABS: 650.0000 mg | ORAL_TABLET | ORAL | Status: DC | PRN

## 2021-11-05 MED ORDER — TETANUS-DIPHTH-ACELL PERTUSSIS 5-2.5-18.5 LF-MCG/0.5 IM SUSY: 0.5000 mL | PREFILLED_SYRINGE | Freq: Once | INTRAMUSCULAR | Status: DC

## 2021-11-05 MED ORDER — COCONUT OIL OIL: 1.0000 "application " | TOPICAL_OIL | Status: DC | PRN

## 2021-11-05 MED ORDER — DIPHENHYDRAMINE HCL 25 MG PO CAPS: 25.0000 mg | ORAL_CAPSULE | Freq: Four times a day (QID) | ORAL | Status: DC | PRN

## 2021-11-05 MED ORDER — OXYCODONE-ACETAMINOPHEN 5-325 MG PO TABS: 2.0000 | ORAL_TABLET | ORAL | Status: DC | PRN

## 2021-11-05 MED ORDER — ONDANSETRON HCL 4 MG/2ML IJ SOLN: 4.0000 mg | INTRAMUSCULAR | Status: DC | PRN

## 2021-11-05 MED ORDER — OXYTOCIN BOLUS FROM INFUSION
333.0000 mL | Freq: Once | INTRAVENOUS | Status: AC
  Administered 2021-11-05: 333 mL via INTRAVENOUS

## 2021-11-05 MED ORDER — SERTRALINE HCL 50 MG PO TABS
50.0000 mg | ORAL_TABLET | Freq: Every day | ORAL | Status: DC
  Filled 2021-11-05 (×2): qty 1

## 2021-11-05 MED ORDER — SOD CITRATE-CITRIC ACID 500-334 MG/5ML PO SOLN: 30.0000 mL | ORAL | Status: DC | PRN

## 2021-11-05 MED ORDER — OXYCODONE HCL 5 MG PO TABS: 5.0000 mg | ORAL_TABLET | ORAL | Status: DC | PRN

## 2021-11-05 MED ORDER — DIBUCAINE (PERIANAL) 1 % EX OINT: 1.0000 "application " | TOPICAL_OINTMENT | CUTANEOUS | Status: DC | PRN

## 2021-11-05 MED ORDER — MISOPROSTOL 25 MCG QUARTER TABLET
25.0000 ug | ORAL_TABLET | ORAL | Status: DC | PRN
  Administered 2021-11-05 (×2): 25 ug via VAGINAL
  Filled 2021-11-05 (×2): qty 1

## 2021-11-05 MED ORDER — ONDANSETRON HCL 4 MG/2ML IJ SOLN
4.0000 mg | Freq: Four times a day (QID) | INTRAMUSCULAR | Status: DC | PRN
  Administered 2021-11-05: 4 mg via INTRAVENOUS
  Filled 2021-11-05: qty 2

## 2021-11-05 MED ORDER — OXYTOCIN-SODIUM CHLORIDE 30-0.9 UT/500ML-% IV SOLN: 2.5000 [IU]/h | INTRAVENOUS | Status: DC

## 2021-11-05 MED ORDER — SERTRALINE HCL 50 MG PO TABS
50.0000 mg | ORAL_TABLET | Freq: Every day | ORAL | Status: DC
  Administered 2021-11-05: 50 mg via ORAL
  Filled 2021-11-05: qty 1

## 2021-11-05 MED ORDER — DIPHENHYDRAMINE HCL 50 MG/ML IJ SOLN: 12.5000 mg | INTRAMUSCULAR | Status: DC | PRN

## 2021-11-05 NOTE — Progress Notes (Signed)
IOL for PIH without severe features. Doing well. Contractions increasingly more painful. Requests CLE. Bps normal to mild range - no s/s severe disease. PIH labs reassuring.  S/p cytotec x 2.  SVE 4/60/-1, AROM clear fluid. Start pitocin.

## 2021-11-05 NOTE — Anesthesia Procedure Notes (Signed)
Epidural Patient location during procedure: OB Start time: 11/05/2021 9:03 AM End time: 11/05/2021 9:13 AM  Staffing Anesthesiologist: Mellody Dance, MD Performed: anesthesiologist   Preanesthetic Checklist Completed: patient identified, IV checked, site marked, risks and benefits discussed, monitors and equipment checked, pre-op evaluation and timeout performed  Epidural Patient position: sitting Prep: DuraPrep Patient monitoring: heart rate, cardiac monitor, continuous pulse ox and blood pressure Approach: midline Location: L3-L4 Injection technique: LOR saline  Needle:  Needle type: Tuohy  Needle gauge: 17 G Needle length: 9 cm Needle insertion depth: 6 cm Catheter type: closed end flexible Catheter size: 20 Guage Catheter at skin depth: 11 cm Test dose: negative and Other  Assessment Events: blood not aspirated, injection not painful, no injection resistance and negative IV test  Additional Notes Informed consent obtained prior to proceeding including risk of failure, 1% risk of PDPH, risk of minor discomfort and bruising.  Discussed rare but serious complications including epidural abscess, permanent nerve injury, epidural hematoma.  Discussed alternatives to epidural analgesia and patient desires to proceed.  Timeout performed pre-procedure verifying patient name, procedure, and platelet count.  Patient tolerated procedure well.

## 2021-11-05 NOTE — Anesthesia Preprocedure Evaluation (Signed)
Anesthesia Evaluation  Patient identified by MRN, date of birth, ID band Patient awake    Reviewed: Allergy & Precautions, NPO status , Patient's Chart, lab work & pertinent test results  Airway Mallampati: II  TM Distance: >3 FB Neck ROM: Full    Dental no notable dental hx.    Pulmonary neg pulmonary ROS, former smoker,    Pulmonary exam normal breath sounds clear to auscultation       Cardiovascular hypertension, Pt. on medications Normal cardiovascular exam Rhythm:Regular Rate:Normal     Neuro/Psych PSYCHIATRIC DISORDERS Anxiety Depression negative neurological ROS     GI/Hepatic negative GI ROS, Neg liver ROS,   Endo/Other  negative endocrine ROS  Renal/GU negative Renal ROS  negative genitourinary   Musculoskeletal negative musculoskeletal ROS (+)   Abdominal   Peds negative pediatric ROS (+)  Hematology negative hematology ROS (+)   Anesthesia Other Findings   Reproductive/Obstetrics (+) Pregnancy                             Anesthesia Physical Anesthesia Plan  ASA: 2  Anesthesia Plan: General   Post-op Pain Management:    Induction:   PONV Risk Score and Plan: 3 and Treatment may vary due to age or medical condition  Airway Management Planned: Natural Airway  Additional Equipment:   Intra-op Plan:   Post-operative Plan:   Informed Consent: I have reviewed the patients History and Physical, chart, labs and discussed the procedure including the risks, benefits and alternatives for the proposed anesthesia with the patient or authorized representative who has indicated his/her understanding and acceptance.       Plan Discussed with: Anesthesiologist  Anesthesia Plan Comments:         Anesthesia Quick Evaluation

## 2021-11-05 NOTE — Progress Notes (Signed)
Comf w/cle Sve 6.5/80/-1  Continue pitocin

## 2021-11-06 ENCOUNTER — Encounter (HOSPITAL_COMMUNITY): Payer: Self-pay | Admitting: Obstetrics and Gynecology

## 2021-11-06 LAB — CBC
HCT: 31.1 % — ABNORMAL LOW (ref 36.0–46.0)
Hemoglobin: 9.8 g/dL — ABNORMAL LOW (ref 12.0–15.0)
MCH: 27.5 pg (ref 26.0–34.0)
MCHC: 31.5 g/dL (ref 30.0–36.0)
MCV: 87.1 fL (ref 80.0–100.0)
Platelets: 215 10*3/uL (ref 150–400)
RBC: 3.57 MIL/uL — ABNORMAL LOW (ref 3.87–5.11)
RDW: 14.4 % (ref 11.5–15.5)
WBC: 7.3 10*3/uL (ref 4.0–10.5)
nRBC: 0 % (ref 0.0–0.2)

## 2021-11-06 MED ORDER — IBUPROFEN 600 MG PO TABS: 600.0000 mg | ORAL_TABLET | Freq: Four times a day (QID) | ORAL | 0 refills | Status: AC

## 2021-11-06 MED ORDER — ACETAMINOPHEN 325 MG PO TABS: 650.0000 mg | ORAL_TABLET | ORAL | 0 refills | Status: AC | PRN

## 2021-11-06 NOTE — Progress Notes (Signed)
Postpartum Progress Note  Post Partum Day 1 s/p spontaneous vaginal delivery.  Patient reports well-controlled pain, ambulating without difficulty, voiding spontaneously, tolerating PO.  Vaginal bleeding is appropriate.   Objective: Blood pressure 117/66, pulse 67, temperature 98.1 F (36.7 C), temperature source Oral, resp. rate 16, height 5\' 6"  (1.676 m), weight 85.3 kg, last menstrual period 01/01/2021, SpO2 99 %, unknown if currently breastfeeding.  Physical Exam:  General: alert and no distress Lochia: appropriate Uterine Fundus: firm DVT Evaluation: No evidence of DVT seen on physical exam.  Recent Labs    11/05/21 0824 11/06/21 0521  HGB 10.2* 9.8*  HCT 32.0* 31.1*    Assessment/Plan: Postpartum Day 1, s/p vaginal delivery. Continue routine postpartum care Lactation following Anticipate discharge home today Blood pressures normal range since delivery without medication.    LOS: 1 day   11/08/21 11/06/2021, 8:20 AM

## 2021-11-06 NOTE — Anesthesia Postprocedure Evaluation (Signed)
Anesthesia Post Note  Patient: Brandi Burns  Procedure(s) Performed: AN AD HOC LABOR EPIDURAL     Patient location during evaluation: Mother Baby Anesthesia Type: Epidural Level of consciousness: awake and alert Pain management: pain level controlled Vital Signs Assessment: post-procedure vital signs reviewed and stable Respiratory status: spontaneous breathing, nonlabored ventilation and respiratory function stable Cardiovascular status: stable Postop Assessment: no headache, no backache, epidural receding, no apparent nausea or vomiting, patient able to bend at knees, adequate PO intake and able to ambulate Anesthetic complications: no   No notable events documented.  Last Vitals:  Vitals:   11/06/21 0025 11/06/21 0523  BP: 127/78 117/66  Pulse: 65 67  Resp: 16 16  Temp: 36.8 C 36.7 C  SpO2:      Last Pain:  Vitals:   11/06/21 0746  TempSrc:   PainSc: 0-No pain   Pain Goal: Patients Stated Pain Goal: 5 (11/05/21 0857)                 Laban Emperor

## 2021-11-07 NOTE — Discharge Summary (Signed)
Obstetric Discharge Summary  Brandi Burns is a 35 y.o. female that presented on 11/05/2021 for induction of labor at 37 weeks for preeclampsia without severe features. Of note, pregnancy was surrogate pregnancy for patient's cousin.  She was admitted to labor and delivery for her induction.  Her labor course was uncomplicated and she delivered a viable female infant on 11/05/21.  Her postpartum course was uncomplicated and on PPD#1, she reported well controlled pain, spontaneous voiding, ambulating without difficulty, and tolerating PO. She did not require any antihypertensives postpartum and had normal range blood pressures following delivery. She was stable for discharge home on 11/06/21 with plans for in-office follow up.  Hemoglobin  Date Value Ref Range Status  11/06/2021 9.8 (L) 12.0 - 15.0 g/dL Final   HCT  Date Value Ref Range Status  11/06/2021 31.1 (L) 36.0 - 46.0 % Final    Physical Exam:  General: alert and no distress Lochia: appropriate Uterine Fundus: firm DVT Evaluation: No evidence of DVT seen on physical exam.  Discharge Diagnoses: Term Pregnancy-delivered  Discharge Information: Date: 11/07/2021 Activity: Pelvic rest, as tolerated Diet: routine Medications: Tylenol, motrin, oxycodone Condition: stable Instructions: Refer to practice specific booklet.  Discussed prior to discharge.  Discharge to: Home  Follow-up Information     [place] Follow up.   Why: Please follow up for 6 week postpartum visit. Contact information: [address] [place] [address] [phone]                 Newborn Data: Live born female  Birth Weight: 7 lb 13.9 oz (3570 g) APGAR: 7, 8  Newborn Delivery   Birth date/time: 11/05/2021 13:20:00 Delivery type: Vaginal, Spontaneous      Home with  biological mother .  Lyn Henri 11/07/2021, 6:47 AM

## 2021-11-18 ENCOUNTER — Telehealth (HOSPITAL_COMMUNITY): Payer: Self-pay | Admitting: *Deleted

## 2021-11-18 NOTE — Telephone Encounter (Signed)
Hospital Discharge Follow-Up Call:  Patient reports that she is well and has no concerns about her healing process. EPDS today was 0 and she endorses this accurately reflects that she is doing well emotionally.  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

## 2022-12-21 ENCOUNTER — Other Ambulatory Visit: Payer: Self-pay | Admitting: Obstetrics and Gynecology

## 2022-12-21 DIAGNOSIS — E041 Nontoxic single thyroid nodule: Secondary | ICD-10-CM

## 2023-01-12 ENCOUNTER — Ambulatory Visit
Admission: RE | Admit: 2023-01-12 | Discharge: 2023-01-12 | Disposition: A | Discharge status: Home / Self Care | Payer: BLUE CROSS/BLUE SHIELD | Source: Ambulatory Visit | Attending: Obstetrics and Gynecology

## 2023-01-12 DIAGNOSIS — E041 Nontoxic single thyroid nodule: Secondary | ICD-10-CM

## 2023-01-13 ENCOUNTER — Other Ambulatory Visit (HOSPITAL_COMMUNITY): Payer: Self-pay

## 2023-01-13 MED ORDER — LISDEXAMFETAMINE DIMESYLATE 20 MG PO CAPS
20.0000 mg | ORAL_CAPSULE | Freq: Every morning | ORAL | 0 refills | Status: DC
  Filled 2023-01-13: qty 30, 30d supply, fill #0

## 2023-01-14 ENCOUNTER — Other Ambulatory Visit (HOSPITAL_COMMUNITY): Payer: Self-pay

## 2023-01-17 ENCOUNTER — Other Ambulatory Visit (HOSPITAL_COMMUNITY): Payer: Self-pay

## 2023-02-09 ENCOUNTER — Other Ambulatory Visit (HOSPITAL_COMMUNITY): Payer: Self-pay

## 2023-02-09 MED ORDER — BUSPIRONE HCL 7.5 MG PO TABS
7.5000 mg | ORAL_TABLET | Freq: Two times a day (BID) | ORAL | 0 refills | Status: AC
  Filled 2023-02-09: qty 60, 30d supply, fill #0

## 2023-02-09 MED ORDER — LISDEXAMFETAMINE DIMESYLATE 20 MG PO CAPS
20.0000 mg | ORAL_CAPSULE | Freq: Every morning | ORAL | 0 refills | Status: AC
  Filled 2023-02-09: qty 30, 30d supply, fill #0

## 2023-02-16 ENCOUNTER — Other Ambulatory Visit (HOSPITAL_COMMUNITY): Payer: Self-pay

## 2023-02-16 MED ORDER — LISDEXAMFETAMINE DIMESYLATE 20 MG PO CAPS
20.0000 mg | ORAL_CAPSULE | Freq: Every morning | ORAL | 0 refills | Status: AC
Start: 2023-02-16 — End: ?
  Filled 2023-02-16 – 2023-02-17 (×4): qty 30, 30d supply, fill #0

## 2023-02-16 MED ORDER — BUSPIRONE HCL 7.5 MG PO TABS
7.5000 mg | ORAL_TABLET | Freq: Two times a day (BID) | ORAL | 0 refills | Status: AC
  Filled 2023-02-16 – 2023-02-17 (×2): qty 60, 30d supply, fill #0

## 2023-02-17 ENCOUNTER — Other Ambulatory Visit (HOSPITAL_COMMUNITY): Payer: Self-pay

## 2023-02-18 ENCOUNTER — Other Ambulatory Visit (HOSPITAL_COMMUNITY): Payer: Self-pay

## 2023-03-02 ENCOUNTER — Other Ambulatory Visit (HOSPITAL_COMMUNITY): Payer: Self-pay

## 2023-12-20 DIAGNOSIS — F900 Attention-deficit hyperactivity disorder, predominantly inattentive type: Secondary | ICD-10-CM | POA: Diagnosis not present

## 2023-12-20 DIAGNOSIS — F3281 Premenstrual dysphoric disorder: Secondary | ICD-10-CM | POA: Diagnosis not present

## 2023-12-26 DIAGNOSIS — Z6825 Body mass index (BMI) 25.0-25.9, adult: Secondary | ICD-10-CM | POA: Diagnosis not present

## 2023-12-26 DIAGNOSIS — F419 Anxiety disorder, unspecified: Secondary | ICD-10-CM | POA: Diagnosis not present

## 2023-12-26 DIAGNOSIS — E041 Nontoxic single thyroid nodule: Secondary | ICD-10-CM | POA: Diagnosis not present

## 2023-12-26 DIAGNOSIS — Z01419 Encounter for gynecological examination (general) (routine) without abnormal findings: Secondary | ICD-10-CM | POA: Diagnosis not present

## 2023-12-26 DIAGNOSIS — Z124 Encounter for screening for malignant neoplasm of cervix: Secondary | ICD-10-CM | POA: Diagnosis not present

## 2023-12-26 DIAGNOSIS — Z1151 Encounter for screening for human papillomavirus (HPV): Secondary | ICD-10-CM | POA: Diagnosis not present

## 2023-12-26 DIAGNOSIS — Z309 Encounter for contraceptive management, unspecified: Secondary | ICD-10-CM | POA: Diagnosis not present

## 2024-01-05 ENCOUNTER — Other Ambulatory Visit: Payer: Self-pay | Admitting: Obstetrics and Gynecology

## 2024-01-05 DIAGNOSIS — E041 Nontoxic single thyroid nodule: Secondary | ICD-10-CM

## 2024-01-13 ENCOUNTER — Ambulatory Visit
Admission: RE | Admit: 2024-01-13 | Discharge: 2024-01-13 | Disposition: A | Discharge status: Home / Self Care | Payer: 59 | Source: Ambulatory Visit | Attending: Obstetrics and Gynecology | Admitting: Obstetrics and Gynecology

## 2024-01-13 DIAGNOSIS — E041 Nontoxic single thyroid nodule: Secondary | ICD-10-CM

## 2024-01-17 DIAGNOSIS — F900 Attention-deficit hyperactivity disorder, predominantly inattentive type: Secondary | ICD-10-CM | POA: Diagnosis not present

## 2024-01-17 DIAGNOSIS — F3281 Premenstrual dysphoric disorder: Secondary | ICD-10-CM | POA: Diagnosis not present

## 2024-01-18 DIAGNOSIS — E041 Nontoxic single thyroid nodule: Secondary | ICD-10-CM | POA: Diagnosis not present

## 2024-03-13 DIAGNOSIS — F900 Attention-deficit hyperactivity disorder, predominantly inattentive type: Secondary | ICD-10-CM | POA: Diagnosis not present

## 2024-03-13 DIAGNOSIS — F3281 Premenstrual dysphoric disorder: Secondary | ICD-10-CM | POA: Diagnosis not present

## 2024-05-15 DIAGNOSIS — E041 Nontoxic single thyroid nodule: Secondary | ICD-10-CM | POA: Diagnosis not present

## 2024-05-21 DIAGNOSIS — E663 Overweight: Secondary | ICD-10-CM | POA: Diagnosis not present

## 2024-05-21 DIAGNOSIS — Z6826 Body mass index (BMI) 26.0-26.9, adult: Secondary | ICD-10-CM | POA: Diagnosis not present

## 2024-05-24 DIAGNOSIS — R102 Pelvic and perineal pain: Secondary | ICD-10-CM | POA: Diagnosis not present

## 2024-06-12 DIAGNOSIS — F411 Generalized anxiety disorder: Secondary | ICD-10-CM | POA: Diagnosis not present

## 2024-06-15 DIAGNOSIS — F411 Generalized anxiety disorder: Secondary | ICD-10-CM | POA: Diagnosis not present

## 2024-06-19 DIAGNOSIS — F411 Generalized anxiety disorder: Secondary | ICD-10-CM | POA: Diagnosis not present

## 2024-07-06 DIAGNOSIS — R635 Abnormal weight gain: Secondary | ICD-10-CM | POA: Diagnosis not present
